# Patient Record
Sex: Female | Born: 1950 | Race: White | Hispanic: No | Marital: Married | Smoking: Never smoker
Health system: Southern US, Community
[De-identification: ages and names within clinical notes are randomized; demographics above are authoritative.]

## PROBLEM LIST (undated history)

## (undated) DIAGNOSIS — E782 Mixed hyperlipidemia: Secondary | ICD-10-CM

## (undated) DIAGNOSIS — K219 Gastro-esophageal reflux disease without esophagitis: Secondary | ICD-10-CM

## (undated) DIAGNOSIS — T7840XA Allergy, unspecified, initial encounter: Secondary | ICD-10-CM

## (undated) DIAGNOSIS — J302 Other seasonal allergic rhinitis: Secondary | ICD-10-CM

## (undated) DIAGNOSIS — E559 Vitamin D deficiency, unspecified: Secondary | ICD-10-CM

## (undated) HISTORY — DX: Allergy, unspecified, initial encounter: T78.40XA

## (undated) HISTORY — PX: BREAST BIOPSY: SHX20

## (undated) HISTORY — PX: TUBAL LIGATION: SHX77

## (undated) HISTORY — DX: Mixed hyperlipidemia: E78.2

## (undated) HISTORY — DX: Gastro-esophageal reflux disease without esophagitis: K21.9

## (undated) HISTORY — PX: LESION REMOVAL: SHX5196

## (undated) HISTORY — DX: Vitamin D deficiency, unspecified: E55.9

## (undated) HISTORY — DX: Other seasonal allergic rhinitis: J30.2

---

## 1994-02-12 HISTORY — PX: ABDOMINAL HYSTERECTOMY: SHX81

## 2017-04-19 ENCOUNTER — Encounter: Payer: Self-pay | Admitting: Family Medicine

## 2017-04-19 ENCOUNTER — Other Ambulatory Visit: Payer: Self-pay

## 2017-04-19 ENCOUNTER — Ambulatory Visit (INDEPENDENT_AMBULATORY_CARE_PROVIDER_SITE_OTHER): Payer: Medicare Other | Admitting: Family Medicine

## 2017-04-19 VITALS — BP 124/82 | HR 85 | Temp 98.0°F | Resp 16 | Ht 63.5 in | Wt 190.0 lb

## 2017-04-19 DIAGNOSIS — E2839 Other primary ovarian failure: Secondary | ICD-10-CM

## 2017-04-19 DIAGNOSIS — Z23 Encounter for immunization: Secondary | ICD-10-CM

## 2017-04-19 DIAGNOSIS — Z6833 Body mass index (BMI) 33.0-33.9, adult: Secondary | ICD-10-CM | POA: Diagnosis not present

## 2017-04-19 DIAGNOSIS — E6609 Other obesity due to excess calories: Secondary | ICD-10-CM

## 2017-04-19 DIAGNOSIS — Z1231 Encounter for screening mammogram for malignant neoplasm of breast: Secondary | ICD-10-CM | POA: Diagnosis not present

## 2017-04-19 DIAGNOSIS — H9311 Tinnitus, right ear: Secondary | ICD-10-CM

## 2017-04-19 DIAGNOSIS — Z1239 Encounter for other screening for malignant neoplasm of breast: Secondary | ICD-10-CM

## 2017-04-19 DIAGNOSIS — L989 Disorder of the skin and subcutaneous tissue, unspecified: Secondary | ICD-10-CM | POA: Diagnosis not present

## 2017-04-19 DIAGNOSIS — R232 Flushing: Secondary | ICD-10-CM | POA: Diagnosis not present
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
## 2017-04-19 NOTE — Patient Instructions (Addendum)
DASH Eating Plan DASH stands for "Dietary Approaches to Stop Hypertension." The DASH eating plan is a healthy eating plan that has been shown to reduce high blood pressure (hypertension). It may also reduce your risk for type 2 diabetes, heart disease, and stroke. The DASH eating plan may also help with weight loss. What are tips for following this plan? General guidelines  Avoid eating more than 2,300 mg (milligrams) of salt (sodium) a day. If you have hypertension, you may need to reduce your sodium intake to 1,500 mg a day.  Limit alcohol intake to no more than 1 drink a day for nonpregnant women and 2 drinks a day for men. One drink equals 12 oz of beer, 5 oz of wine, or 1 oz of hard liquor.  Work with your health care provider to maintain a healthy body weight or to lose weight. Ask what an ideal weight is for you.  Get at least 30 minutes of exercise that causes your heart to beat faster (aerobic exercise) most days of the week. Activities may include walking, swimming, or biking.  Work with your health care provider or diet and nutrition specialist (dietitian) to adjust your eating plan to your individual calorie needs. Reading food labels  Check food labels for the amount of sodium per serving. Choose foods with less than 5 percent of the Daily Value of sodium. Generally, foods with less than 300 mg of sodium per serving fit into this eating plan.  To find whole grains, look for the word "whole" as the first word in the ingredient list. Shopping  Buy products labeled as "low-sodium" or "no salt added."  Buy fresh foods. Avoid canned foods and premade or frozen meals. Cooking  Avoid adding salt when cooking. Use salt-free seasonings or herbs instead of table salt or sea salt. Check with your health care provider or pharmacist before using salt substitutes.  Do not fry foods. Cook foods using healthy methods such as baking, boiling, grilling, and broiling instead.  Cook with  heart-healthy oils, such as olive, canola, soybean, or sunflower oil. Meal planning   Eat a balanced diet that includes: ? 5 or more servings of fruits and vegetables each day. At each meal, try to fill half of your plate with fruits and vegetables. ? Up to 6-8 servings of whole grains each day. ? Less than 6 oz of lean meat, poultry, or fish each day. A 3-oz serving of meat is about the same size as a deck of cards. One egg equals 1 oz. ? 2 servings of low-fat dairy each day. ? A serving of nuts, seeds, or beans 5 times each week. ? Heart-healthy fats. Healthy fats called Omega-3 fatty acids are found in foods such as flaxseeds and coldwater fish, like sardines, salmon, and mackerel.  Limit how much you eat of the following: ? Canned or prepackaged foods. ? Food that is high in trans fat, such as fried foods. ? Food that is high in saturated fat, such as fatty meat. ? Sweets, desserts, sugary drinks, and other foods with added sugar. ? Full-fat dairy products.  Do not salt foods before eating.  Try to eat at least 2 vegetarian meals each week.  Eat more home-cooked food and less restaurant, buffet, and fast food.  When eating at a restaurant, ask that your food be prepared with less salt or no salt, if possible. What foods are recommended? The items listed may not be a complete list. Talk with your dietitian about what   dietary choices are best for you. Grains Whole-grain or whole-wheat bread. Whole-grain or whole-wheat pasta. Brown rice. Orpah Cobb. Bulgur. Whole-grain and low-sodium cereals. Pita bread. Low-fat, low-sodium crackers. Whole-wheat flour tortillas. Vegetables Fresh or frozen vegetables (raw, steamed, roasted, or grilled). Low-sodium or reduced-sodium tomato and vegetable juice. Low-sodium or reduced-sodium tomato sauce and tomato paste. Low-sodium or reduced-sodium canned vegetables. Fruits All fresh, dried, or frozen fruit. Canned fruit in natural juice (without  added sugar). Meat and other protein foods Skinless chicken or [place]. Ground chicken or [place]. Pork with fat trimmed off. Fish and seafood. Egg whites. Dried beans, peas, or lentils. Unsalted nuts, nut butters, and seeds. Unsalted canned beans. Lean cuts of beef with fat trimmed off. Low-sodium, lean deli meat. Dairy Low-fat (1%) or fat-free (skim) milk. Fat-free, low-fat, or reduced-fat cheeses. Nonfat, low-sodium ricotta or cottage cheese. Low-fat or nonfat yogurt. Low-fat, low-sodium cheese. Fats and oils Soft margarine without trans fats. Vegetable oil. Low-fat, reduced-fat, or light mayonnaise and salad dressings (reduced-sodium). Canola, safflower, olive, soybean, and sunflower oils. Avocado. Seasoning and other foods Herbs. Spices. Seasoning mixes without salt. Unsalted popcorn and pretzels. Fat-free sweets. What foods are not recommended? The items listed may not be a complete list. Talk with your dietitian about what dietary choices are best for you. Grains Baked goods made with fat, such as croissants, muffins, or some breads. Dry pasta or rice meal packs. Vegetables Creamed or fried vegetables. Vegetables in a cheese sauce. Regular canned vegetables (not low-sodium or reduced-sodium). Regular canned tomato sauce and paste (not low-sodium or reduced-sodium). Regular tomato and vegetable juice (not low-sodium or reduced-sodium). Rosita Fire. Olives. Fruits Canned fruit in a light or heavy syrup. Fried fruit. Fruit in cream or butter sauce. Meat and other protein foods Fatty cuts of meat. Ribs. Fried meat. Tomasa Blase. Sausage. Bologna and other processed lunch meats. Salami. Fatback. Hotdogs. Bratwurst. Salted nuts and seeds. Canned beans with added salt. Canned or smoked fish. Whole eggs or egg yolks. Chicken or [place] with skin. Dairy Whole or 2% milk, cream, and half-and-half. Whole or full-fat cream cheese. Whole-fat or sweetened yogurt. Full-fat cheese. Nondairy creamers. Whipped toppings.  Processed cheese and cheese spreads. Fats and oils Butter. Stick margarine. Lard. Shortening. Ghee. Bacon fat. Tropical oils, such as coconut, palm kernel, or palm oil. Seasoning and other foods Salted popcorn and pretzels. Onion salt, garlic salt, seasoned salt, table salt, and sea salt. Worcestershire sauce. Tartar sauce. Barbecue sauce. Teriyaki sauce. Soy sauce, including reduced-sodium. Steak sauce. Canned and packaged gravies. Fish sauce. Oyster sauce. Cocktail sauce. Horseradish that you find on the shelf. Ketchup. Mustard. Meat flavorings and tenderizers. Bouillon cubes. Hot sauce and Tabasco sauce. Premade or packaged marinades. Premade or packaged taco seasonings. Relishes. Regular salad dressings. Where to find more information:  National Heart, Lung, and Blood Institute: PopSteam.is  American Heart Association: www.heart.org Summary  The DASH eating plan is a healthy eating plan that has been shown to reduce high blood pressure (hypertension). It may also reduce your risk for type 2 diabetes, heart disease, and stroke.  With the DASH eating plan, you should limit salt (sodium) intake to 2,300 mg a day. If you have hypertension, you may need to reduce your sodium intake to 1,500 mg a day. When on the DASH eating plan, aim to eat more fresh fruits and vegetables, whole grains, lean proteins, low-fat dairy, and heart-healthy fats.Menopause and Herbal Products What is menopause? Menopause is the normal time of life when menstrual periods decrease in frequency and eventually stop  completely. This process can take several years for some women. Menopause is complete when you have had an absence of menstruation for a full year since your last menstrual period. It usually occurs between the ages of 25 and 33. It is not common for menopause to begin before the age of 85. During menopause, your body stops producing the female hormones estrogen and progesterone. Common symptoms associated  with this loss of hormones (vasomotor symptoms) are: Hot flashes. Hot flushes. Night sweats.  Other common symptoms and complications of menopause include: Decrease in sex drive. Vaginal dryness and thinning of the walls of the vagina. This can make sex painful. Dryness of the skin and development of wrinkles. Headaches. Tiredness. Irritability. Memory problems. Weight gain. Bladder infections. Hair growth on the face and chest. Inability to reproduce offspring (infertility). Loss of density in the bones (osteoporosis) increasing your risk for breaks (fractures). Depression. Hardening and narrowing of the arteries (atherosclerosis). This increases your risk of heart attack and stroke.  What treatment options are available? There are many treatment choices for menopause symptoms. The most common treatment is hormone replacement therapy. Many alternative therapies for menopause are emerging, including the use of herbal products. These supplements can be found in the form of herbs, teas, oils, tinctures, and pills. Common herbal supplements for menopause are made from plants that contain phytoestrogens. Phytoestrogens are compounds that occur naturally in plants and plant products. They act like estrogen in the body. Foods and herbs that contain phytoestrogens include: Soy. Flax seeds. Red clover. Ginseng.  What menopause symptoms may be helped if I use herbal products? Vasomotor symptoms. These may be helped by: Soy. Some studies show that soy may have a moderate benefit for hot flashes. Black cohosh. There is limited evidence indicating this may be beneficial for hot flashes. Symptoms that are related to heart and blood vessel disease. These may be helped by soy. Studies have shown that soy can help to lower cholesterol. Depression. This may be helped by: St. John's wort. There is limited evidence that shows this may help mild to moderate depression. Black cohosh. There is evidence  that this may help depression and mood swings. Osteoporosis. Soy may help to decrease bone loss that is associated with menopause and may prevent osteoporosis. Limited evidence indicates that red clover may offer some bone loss protection as well. Other herbal products that are commonly used during menopause lack enough evidence to support their use as a replacement for conventional menopause therapies. These products include evening primrose, ginseng, and red clover. What are the cases when herbal products should not be used during menopause? Do not use herbal products during menopause without your health care provider's approval if: You are taking medicine. You have a preexisting liver condition.  Are there any risks in my taking herbal products during menopause? If you choose to use herbal products to help with symptoms of menopause, keep in mind that: Different supplements have different and unmeasured amounts of herbal ingredients. Herbal products are not regulated the same way that medicines are. Concentrations of herbs may vary depending on the way they are prepared. For example, the concentration may be different in a pill, tea, oil, and tincture. Little is known about the risks of using herbal products, particularly the risks of long-term use. Some herbal supplements can be harmful when combined with certain medicines.  Most commonly reported side effects of herbal products are mild. However, if used improperly, many herbal supplements can cause serious problems. Talk to your  health care provider before starting any herbal product. If problems develop, stop taking the supplement and let your health care provider know. This information is not intended to replace advice given to you by your health care provider. Make sure you discuss any questions you have with your health care provider. Document Released: 07/18/2007 Document Revised: 12/27/2015 Document Reviewed: 07/14/2013 Elsevier  Interactive Patient Education  2017 [employer]. Venlafaxine extended-release capsules What is this medicine? VENLAFAXINE(VEN la fax een) is used to treat depression, anxiety and panic disorder. This medicine may be used for other purposes; ask your health care provider or pharmacist if you have questions. COMMON BRAND NAME(S): Effexor XR What should I tell my health care provider before I take this medicine? They need to know if you have any of these conditions: -bleeding disorders -glaucoma -heart disease -high blood pressure -high cholesterol -kidney disease -liver disease -low levels of sodium in the blood -mania or bipolar disorder -seizures -suicidal thoughts, plans, or attempt; a previous suicide attempt by you or a family -take medicines that treat or prevent blood clots -thyroid disease -an unusual or allergic reaction to venlafaxine, desvenlafaxine, other medicines, foods, dyes, or preservatives -pregnant or trying to get pregnant -breast-feeding How should I use this medicine? Take this medicine by mouth with a full glass of water. Follow the directions on the prescription label. Do not cut, crush, or chew this medicine. Take it with food. If needed, the capsule may be carefully opened and the entire contents sprinkled on a spoonful of cool applesauce. Swallow the applesauce/pellet mixture right away without chewing and follow with a glass of water to ensure complete swallowing of the pellets. Try to take your medicine at about the same time each day. Do not take your medicine more often than directed. Do not stop taking this medicine suddenly except upon the advice of your doctor. Stopping this medicine too quickly may cause serious side effects or your condition may worsen. A special MedGuide will be given to you by the pharmacist with each prescription and refill. Be sure to read this information carefully each time. Talk to your pediatrician regarding the use of this  medicine in children. Special care may be needed. Overdosage: If you think you have taken too much of this medicine contact a poison control center or emergency room at once. NOTE: This medicine is only for you. Do not share this medicine with others. What if I miss a dose? If you miss a dose, take it as soon as you can. If it is almost time for your next dose, take only that dose. Do not take double or extra doses. What may interact with this medicine? Do not take this medicine with any of the following medications: -certain medicines for fungal infections like fluconazole, itraconazole, ketoconazole, posaconazole, voriconazole -cisapride -desvenlafaxine -dofetilide -dronedarone -duloxetine -levomilnacipran -linezolid -MAOIs like Carbex, Eldepryl, Marplan, Nardil, and Parnate -methylene blue (injected into a vein) -milnacipran -pimozide -thioridazine -ziprasidone This medicine may also interact with the following medications: -amphetamines -aspirin and aspirin-like medicines -certain medicines for depression, anxiety, or psychotic disturbances -certain medicines for migraine headaches like almotriptan, eletriptan, frovatriptan, naratriptan, rizatriptan, sumatriptan, zolmitriptan -certain medicines for sleep -certain medicines that treat or prevent blood clots like dalteparin, enoxaparin, warfarin -cimetidine -clozapine -diuretics -fentanyl -furazolidone -indinavir -isoniazid -lithium -metoprolol -NSAIDS, medicines for pain and inflammation, like ibuprofen or naproxen -other medicines that prolong the QT interval (cause an abnormal heart rhythm) -procarbazine -rasagiline -supplements like St. John's wort, kava kava, valerian -tramadol -tryptophan This  list may not describe all possible interactions. Give your health care provider a list of all the medicines, herbs, non-prescription drugs, or dietary supplements you use. Also tell them if you smoke, drink alcohol, or use  illegal drugs. Some items may interact with your medicine. What should I watch for while using this medicine? Tell your doctor if your symptoms do not get better or if they get worse. Visit your doctor or health care professional for regular checks on your progress. Because it may take several weeks to see the full effects of this medicine, it is important to continue your treatment as prescribed by your doctor. Patients and their families should watch out for new or worsening thoughts of suicide or depression. Also watch out for sudden changes in feelings such as feeling anxious, agitated, panicky, irritable, hostile, aggressive, impulsive, severely restless, overly excited and hyperactive, or not being able to sleep. If this happens, especially at the beginning of treatment or after a change in dose, call your health care professional. This medicine can cause an increase in blood pressure. Check with your doctor for instructions on monitoring your blood pressure while taking this medicine. You may get drowsy or dizzy. Do not drive, use machinery, or do anything that needs mental alertness until you know how this medicine affects you. Do not stand or sit up quickly, especially if you are an older patient. This reduces the risk of dizzy or fainting spells. Alcohol may interfere with the effect of this medicine. Avoid alcoholic drinks. Your mouth may get dry. Chewing sugarless gum, sucking hard candy and drinking plenty of water will help. Contact your doctor if the problem does not go away or is severe. What side effects may I notice from receiving this medicine? Side effects that you should report to your doctor or health care professional as soon as possible: -allergic reactions like skin rash, itching or hives, swelling of the face, lips, or tongue -anxious -breathing problems -confusion -changes in vision -chest pain -confusion -elevated mood, decreased need for sleep, racing thoughts, impulsive  behavior -eye pain -fast, irregular heartbeat -feeling faint or lightheaded, falls -feeling agitated, angry, or irritable -hallucination, loss of contact with reality -high blood pressure -loss of balance or coordination -palpitations -redness, blistering, peeling or loosening of the skin, including inside the mouth -restlessness, pacing, inability to keep still -seizures -stiff muscles -suicidal thoughts or other mood changes -trouble passing urine or change in the amount of urine -trouble sleeping -unusual bleeding or bruising -unusually weak or tired -vomiting Side effects that usually do not require medical attention (report to your doctor or health care professional if they continue or are bothersome): -change in sex drive or performance -change in appetite or weight -constipation -dizziness -dry mouth -headache -increased sweating -nausea -tired This list may not describe all possible side effects. Call your doctor for medical advice about side effects. You may report side effects to FDA at 1-800-FDA-1088. Where should I keep my medicine? Keep out of the reach of children. Store at a controlled temperature between 20 and 25 degrees C (68 degrees and 77 degrees F), in a dry place. Throw away any unused medicine after the expiration date. NOTE: This sheet is a summary. It may not cover all possible information. If you have questions about this medicine, talk to your doctor, pharmacist, or health care provider.  2018 Elsevier/Gold Standard (2015-06-30 18:38:02)    Work with your health care provider or diet and nutrition specialist (dietitian) to adjust your eating plan  to your individual calorie needs. This information is not intended to replace advice given to you by your health care provider. Make sure you discuss any questions you have with your health care provider. Document Released: 01/18/2011 Document Revised: 01/23/2016 Document Reviewed: 01/23/2016 Elsevier  Interactive Patient Education  [employer]2018 Elsevier Inc.

## 2017-05-02 ENCOUNTER — Telehealth: Payer: Self-pay | Admitting: Family Medicine

## 2017-05-02 NOTE — Telephone Encounter (Signed)
No ENT referral in patients workquque

## 2017-05-02 NOTE — Telephone Encounter (Signed)
Patient left message to check on status of ENT referral

## 2017-05-02 NOTE — Telephone Encounter (Signed)
Referral is in computer but not how Dusty can see. She is fixing.

## 2017-05-24 ENCOUNTER — Other Ambulatory Visit: Payer: Self-pay

## 2017-05-24 ENCOUNTER — Encounter: Payer: Self-pay | Admitting: Family Medicine

## 2017-05-24 ENCOUNTER — Other Ambulatory Visit (HOSPITAL_COMMUNITY)
Admission: RE | Admit: 2017-05-24 | Discharge: 2017-05-24 | Disposition: A | Discharge status: Home / Self Care | Payer: Medicare Other | Source: Ambulatory Visit | Attending: Family Medicine | Admitting: Family Medicine

## 2017-05-24 ENCOUNTER — Ambulatory Visit (INDEPENDENT_AMBULATORY_CARE_PROVIDER_SITE_OTHER): Payer: Medicare Other | Admitting: Family Medicine

## 2017-05-24 VITALS — BP 130/86 | HR 94 | Temp 97.9°F | Resp 16 | Ht 64.0 in | Wt 190.0 lb

## 2017-05-24 DIAGNOSIS — Z0000 Encounter for general adult medical examination without abnormal findings: Secondary | ICD-10-CM

## 2017-05-24 DIAGNOSIS — N3001 Acute cystitis with hematuria: Secondary | ICD-10-CM | POA: Diagnosis not present

## 2017-05-24 DIAGNOSIS — Z780 Asymptomatic menopausal state: Secondary | ICD-10-CM | POA: Diagnosis not present

## 2017-05-24 DIAGNOSIS — Z23 Encounter for immunization: Secondary | ICD-10-CM | POA: Diagnosis not present

## 2017-05-24 DIAGNOSIS — R300 Dysuria: Secondary | ICD-10-CM

## 2017-05-24 DIAGNOSIS — L989 Disorder of the skin and subcutaneous tissue, unspecified: Secondary | ICD-10-CM | POA: Diagnosis not present

## 2017-05-24 LAB — CBC WITH DIFFERENTIAL/PLATELET
BASOS PCT: 0.4 %
Basophils Absolute: 21 cells/uL (ref 0–200)
Eosinophils Absolute: 177 cells/uL (ref 15–500)
Eosinophils Relative: 3.4 %
HEMATOCRIT: 40.5 % (ref 35.0–45.0)
HEMOGLOBIN: 13.8 g/dL (ref 11.7–15.5)
LYMPHS ABS: 1258 {cells}/uL (ref 850–3900)
MCH: 28.6 pg (ref 27.0–33.0)
MCHC: 34.1 g/dL (ref 32.0–36.0)
MCV: 84 fL (ref 80.0–100.0)
MONOS PCT: 7.1 %
MPV: 10.7 fL (ref 7.5–12.5)
NEUTROS ABS: 3375 {cells}/uL (ref 1500–7800)
Neutrophils Relative %: 64.9 %
Platelets: 191 10*3/uL (ref 140–400)
RBC: 4.82 10*6/uL (ref 3.80–5.10)
RDW: 12.6 % (ref 11.0–15.0)
Total Lymphocyte: 24.2 %
WBC: 5.2 10*3/uL (ref 3.8–10.8)
WBCMIX: 369 {cells}/uL (ref 200–950)

## 2017-05-24 LAB — LIPID PANEL
CHOL/HDL RATIO: 4.6 (calc) (ref <5.0)
CHOLESTEROL: 251 mg/dL — AB (ref <200)
HDL: 55 mg/dL (ref >50)
LDL CHOLESTEROL (CALC): 170 mg/dL — AB
NON-HDL CHOLESTEROL (CALC): 196 mg/dL — AB (ref <130)
Triglycerides: 126 mg/dL (ref <150)

## 2017-05-24 LAB — COMPLETE METABOLIC PANEL WITH GFR
AG Ratio: 2 (calc) (ref 1.0–2.5)
ALT: 16 U/L (ref 6–29)
AST: 16 U/L (ref 10–35)
Albumin: 4.1 g/dL (ref 3.6–5.1)
Alkaline phosphatase (APISO): 106 U/L (ref 33–130)
BUN: 18 mg/dL (ref 7–25)
CALCIUM: 8.8 mg/dL (ref 8.6–10.4)
CO2: 29 mmol/L (ref 20–32)
CREATININE: 0.7 mg/dL (ref 0.50–0.99)
Chloride: 107 mmol/L (ref 98–110)
GFR, EST AFRICAN AMERICAN: 105 mL/min/{1.73_m2} (ref >=60)
GFR, Est Non African American: 91 mL/min/{1.73_m2} (ref >=60)
GLUCOSE: 98 mg/dL (ref 65–99)
Globulin: 2.1 g/dL (calc) (ref 1.9–3.7)
POTASSIUM: 4 mmol/L (ref 3.5–5.3)
Sodium: 141 mmol/L (ref 135–146)
TOTAL PROTEIN: 6.2 g/dL (ref 6.1–8.1)
Total Bilirubin: 0.4 mg/dL (ref 0.2–1.2)

## 2017-05-24 LAB — POCT URINALYSIS DIPSTICK
Bilirubin, UA: NEGATIVE
Glucose, UA: NEGATIVE
KETONES UA: NEGATIVE
Nitrite, UA: POSITIVE
PH UA: 5.5 (ref 5.0–8.0)
Spec Grav, UA: >=1.03 — AB (ref 1.010–1.025)
Urobilinogen, UA: 0.2 E.U./dL

## 2017-05-24 MED ORDER — CIPROFLOXACIN HCL 250 MG PO TABS: 250.0000 mg | ORAL_TABLET | Freq: Two times a day (BID) | ORAL | 0 refills | Status: AC

## 2017-05-24 NOTE — Patient Instructions (Signed)
Please schedule mammogram and DEXA scan at check-out

## 2017-05-24 NOTE — Progress Notes (Signed)
Subjective:    Laura Sellers is a 66 y.o. female who presents for a Welcome to Medicare exam.   Review of Systems Review of Systems  Constitutional: Negative for activity change, appetite change and fever.  HENT: Negative for ear discharge, nosebleeds, postnasal drip, sinus pressure, sneezing, sore throat, tinnitus, trouble swallowing and voice change.   Eyes: Negative for visual disturbance.  Respiratory: Negative for cough, chest tightness, shortness of breath, wheezing and stridor.   Cardiovascular: Negative for chest pain, palpitations and leg swelling.  Gastrointestinal: Negative for abdominal pain, nausea, rectal pain and vomiting.  Endocrine: Negative for polyphagia and polyuria.  Genitourinary: Positive for dysuria. Negative for difficulty urinating, dyspareunia, flank pain, frequency, genital sores, hematuria, pelvic pain, urgency, vaginal bleeding, vaginal discharge and vaginal pain.  Skin: Negative for color change, rash and wound.  Neurological: Negative for dizziness, tremors, syncope, facial asymmetry, speech difficulty, weakness, light-headedness and numbness.  Hematological: Negative for adenopathy.  Has had some moles that have increased in size on body in diffent places.  Has upcoming appointment with ENT for clicking sound in ear. Has had some burning with urination over the past several days.       Objective:    Today's Vitals   05/24/17 0807  BP: 130/86  Pulse: 94  Resp: 16  Temp: 97.9 F (36.6 C)  TempSrc: Temporal  SpO2: 97%  Weight: 190 lb (86.2 kg)  Height: 5\' 4"  (1.626 m)  Body mass index is 32.61 kg/m.  Medications Outpatient Encounter Medications as of 05/24/2017  Medication Sig  . Calcium Carb-Cholecalciferol (CALCIUM 600+D) 600-800 MG-UNIT TABS Take 1 tablet by mouth 2 (two) times daily.  . Coenzyme Q10 (COQ-10) 50 MG CAPS Take 3 capsules by mouth daily.  . Multiple Vitamin (MULTIVITAMIN) capsule Take 1 capsule by mouth daily.  .  [DISCONTINUED] calcium acetate (PHOSLO) 667 MG capsule Take by mouth 3 (three) times daily with meals.  . ciprofloxacin (CIPRO) 250 MG tablet Take 1 tablet (250 mg total) by mouth 2 (two) times daily for 5 days.   No facility-administered encounter medications on file as of 05/24/2017.      History: Past Medical History:  Diagnosis Date  . Allergy    Past Surgical History:  Procedure Laterality Date  . ABDOMINAL HYSTERECTOMY  1996   complete; done for heavy frequent menses.   . LESION REMOVAL Left    Breast; benign  . TUBAL LIGATION      Family History  Problem Relation Age of Onset  . Heart disease Mother   . Obesity Father   . Heart disease Father   . Breast cancer Sister    Social History   Occupational History  . Not on file  Tobacco Use  . Smoking status: Never Smoker  . Smokeless tobacco: Never Used  Substance and Sexual Activity  . Alcohol use: No    Frequency: Never  . Drug use: No  . Sexual activity: Not Currently    Partners: Male    Birth control/protection: Surgical    Tobacco Counseling Counseling given: Yes Does not smoke and does not plan to smoke.   Immunizations and Health Maintenance Immunization History  Administered Date(s) Administered  . Td 05/24/2017   Health Maintenance Due  Topic Date Due      . PAP SMEAR  Done today  . MAMMOGRAM  ordered  . DEXA SCAN  ordered    Activities of Daily Living In your present state of health, do you have any difficulty  performing the following activities: 05/24/2017  Hearing? N  Vision? N  Difficulty concentrating or making decisions? N  Walking or climbing stairs? N  Dressing or bathing? N  Doing errands, shopping? N    Physical Exam  Vital signs reviewed.  Patient alert and oriented no acute distress.  Pleasant white female.  Normocephalic atraumatic.  Pupils equally round reactive to light.  Corrective lenses worn.  Extraocular muscles intact.  No lesions in oral cavity.  Dentition in good  repair.  Uvula midline.  No edema in oral cavity.  External ear canals patent.  Nasal turbinates clear bilaterally.  No edema.  Moist mucous membranes.  Neck supple with full range of motion.  Thyroid not palpable.  No carotid bruits auscultated bilaterally.  Skin with multiple pigmented irregular shaped lesions throughout.  Evidence of cherry angiomas.  Senile lentigines present.  Scattered seborrheic keratoses present.  Heart regular rate and rhythm.  Lungs clear to auscultation bilaterally.  Abdomen is soft, nondistended nontender.  Positive bowel sounds.  No hepatosplenomegaly.  Cranial nerves II through XII grossly intact.  Strength 5 out of 5 in upper and lower extremities.  2+ dorsalis pedis pulses.  No CVA tenderness to palpation.  Breasts with normal shape and symmetry bilaterally.  No palpable masses in breast bilaterally.  No nipple discharge present.  No skin lesions of breasts bilaterally.  Normal female external genitalia.  Labia normal.  No skin lesions in vaginal area.  No cervix present on examination.  No adnexal palpable.  Mood euthymic.  Affect congruent with mood. Advanced Directives: deferred.       Assessment:    This is a routine wellness examination for this patient .   Vision/Hearing screen  Visual Acuity Screening   Right eye Left eye Both eyes  Without correction:     With correction: 20/20 20/20 20/20     Dietary issues and exercise activities discussed:    Lifestyle modifications discussed with patient including a diet emphasizing vegetables, fruits, and whole grains. Limiting intake of sodium to less than 2,400 mg per day.  Recommendations discussed include consuming low-fat dairy products, poultry, fish, legumes, non-tropical vegetable oils, and nuts; and limiting intake of sweets, sugar-sweetened beverages, and red meat. Discussed following a plan such as the Dietary Approaches to Stop Hypertension (DASH) diet. Patient to read up on this diet.  The patient is asked  to make an attempt to improve diet and exercise patterns to aid in medical management of this problem.   Goals    Weight loss Exercise routinely      Depression Screen PHQ 2/9 Scores 05/24/2017 04/19/2017  PHQ - 2 Score 0 0     Fall Risk Fall Risk  05/24/2017  Falls in the past year? No    Cognitive Function: normal.       Activities of Daily Living In your present state of health, do you have any difficulty performing the following activities: 05/24/2017  Hearing? N  Vision? N  Difficulty concentrating or making decisions? N  Walking or climbing stairs? N  Dressing or bathing? N  Doing errands, shopping? N     Immunizations and Health Maintenance Immunization History  Administered Date(s) Administered  . Td 05/24/2017   Health Maintenance Due  Topic Date Due  . TETANUS/TDAP  02/12/1971  . PAP SMEAR  02/11/1973  . MAMMOGRAM  02/11/2002  . DEXA SCAN  02/11/2017   Screening Tests Health Maintenance  Topic Date Due  . TETANUS/TDAP  02/12/1971  . PAP  SMEAR  02/11/1973  . MAMMOGRAM  02/11/2002  . DEXA SCAN  02/11/2017  . INFLUENZA VACCINE  12/20/2017 (Originally 09/12/2017)  . Hepatitis C Screening  04/20/2018 (Originally May 22, 1950)  . HIV Screening  04/20/2018 (Originally 02/12/1967)  . PNA vac Low Risk Adult (1 of 2 - PCV13) 04/20/2018 (Originally 02/11/2017)  . COLONOSCOPY  02/13/2020    Qualifies for Shingles Vaccine? Patient defers vaccine.   Cancer Screenings: Lung: Low Dose CT Chest recommended if Age 49-80 years, 30 pack-year currently smoking OR have quit w/in 15years. Patient does not qualify. Breast: Up to date on Mammogram? ordered   Up to date of Bone Density/Dexa? ordered Colorectal: UTD  Additional Screenings:  Hepatitis C Screening: ordered.      Plan:    I have personally reviewed and noted the following in the patient's chart:   . Medical and social history . Use of alcohol, tobacco or illicit drugs  . Current medications and  supplements . Functional ability and status . Nutritional status . Physical activity Personally reviewed all the history. EKG done.  Age appropriate anticipatory guidance given.  Labs ordered.  UTI: POCT positive. Culture ordered. Treat with cipro as ordered, cipro 250 mg po bid for 7 days.  Counseled Regarding worrisome signs of UTI emergency department or return to clinic. Skin lesion: refer to derm for full skin survey.   Janine Limbo. Tracie Harrier, MD

## 2017-05-24 NOTE — Progress Notes (Signed)
Patient ID: Laura Sellers, female    DOB: 1950/06/05, 67 y.o.   MRN: [id number]  Chief Complaint  Patient presents with  . Annual Exam  . Medicare Wellness    Welcome to Medicare    Allergies Patient has no known allergies.  Subjective:   Laura Sellers is a 67 y.o. female who presents to [facility] today.  HPI HPI  Past Medical History:  Diagnosis Date  . Allergy     Past Surgical History:  Procedure Laterality Date  . ABDOMINAL HYSTERECTOMY  1996   complete; done for heavy frequent menses.   . LESION REMOVAL Left    Breast; benign  . TUBAL LIGATION      Family History  Problem Relation Age of Onset  . Heart disease Mother   . Obesity Father   . Heart disease Father      Social History   Socioeconomic History  . Marital status: Married    Spouse name: Not on file  . Number of children: Not on file  . Years of education: Not on file  . Highest education level: Not on file  Occupational History  . Not on file  Social Needs  . Financial resource strain: Not on file  . Food insecurity:    Worry: Not on file    Inability: Not on file  . Transportation needs:    Medical: Not on file    Non-medical: Not on file  Tobacco Use  . Smoking status: Never Smoker  . Smokeless tobacco: Never Used  Substance and Sexual Activity  . Alcohol use: No    Frequency: Never  . Drug use: No  . Sexual activity: Never    Birth control/protection: Surgical  Lifestyle  . Physical activity:    Days per week: Not on file    Minutes per session: Not on file  . Stress: Not on file  Relationships  . Social connections:    Talks on phone: Not on file    Gets together: Not on file    Attends religious service: Not on file    Active member of club or organization: Not on file    Attends meetings of clubs or organizations: Not on file    Relationship status: Not on file  Other Topics Concern  . Not on file  Social History Narrative   Moved to this  area several years ago for a job. Moved from near [place], [place]. Quit the job after 6 weeks b/c did not like the job. Thought would find another job. Is not working at this time. Mother still lives in [place] and she helps take care of her. Originally born in [place], [place].   Married for over 47 years.    Three children, all in their 26s.   Youngest son has Type 1 diabetes. Has two grandchildren.   Enjoys time with friends, walk, read.   Attends church at [employer] in [place], [place].   Got Medicare for insurance.    Eats all food groups.   Wears seatbelt. Drives.    Smoke detectors in home.     Review of Systems   Objective:   BP 130/86 (BP Location: Left Arm, Patient Position: Sitting, Cuff Size: Normal)   Pulse 94   Temp 97.9 F (36.6 C) (Temporal)   Resp 16   Ht 5\' 4"  (1.626 m)   Wt 190 lb (86.2 kg)   SpO2 97%   BMI 32.61 kg/m  Physical Exam   Assessment and Plan   There are no diagnoses linked to this encounter.   No follow-ups on file. Mack HookBreanna  Carah Barrientes, LPN 1/61/09604/01/2018

## 2017-05-27 LAB — URINE CULTURE

## 2017-05-28 ENCOUNTER — Encounter: Payer: Self-pay | Admitting: Family Medicine

## 2017-05-28 ENCOUNTER — Telehealth: Payer: Self-pay | Admitting: Family Medicine

## 2017-05-28 DIAGNOSIS — R8290 Unspecified abnormal findings in urine: Secondary | ICD-10-CM

## 2017-05-28 LAB — CYTOLOGY - PAP: DIAGNOSIS: NEGATIVE

## 2017-05-28 NOTE — Telephone Encounter (Signed)
Left generic message requesting call back.  

## 2017-05-28 NOTE — Telephone Encounter (Signed)
Please call and advise that her urine did grow out greater than 100, 000 colonies of e. Coli bacteria. She does have a urinary tract infection and she needs to complete all the medication that I gave her at her visit.  In addition, she should come back to the lab for a repeat urine test in one month or let us know sooner if her symptoms are not better or they return.   Please advise her that her cholesterol is very high. She needs to follow up to discuss initiating medication to decrease her cholesterol and lower her cardiovascular risk.   Advise that urine specimen can be left at the lab beside our office.

## 2017-05-29 NOTE — Telephone Encounter (Signed)
Patient informed of message below, verbalized understanding.  

## 2017-06-03 ENCOUNTER — Ambulatory Visit (HOSPITAL_COMMUNITY)
Admission: RE | Admit: 2017-06-03 | Discharge: 2017-06-03 | Disposition: A | Discharge status: Home / Self Care | Payer: Medicare Other | Source: Ambulatory Visit | Attending: Family Medicine | Admitting: Family Medicine

## 2017-06-03 ENCOUNTER — Encounter (HOSPITAL_COMMUNITY): Payer: Self-pay

## 2017-06-03 ENCOUNTER — Other Ambulatory Visit (HOSPITAL_COMMUNITY): Payer: Medicare Other

## 2017-06-03 DIAGNOSIS — Z13820 Encounter for screening for osteoporosis: Secondary | ICD-10-CM | POA: Diagnosis present

## 2017-06-03 DIAGNOSIS — M8588 Other specified disorders of bone density and structure, other site: Secondary | ICD-10-CM | POA: Diagnosis not present

## 2017-06-03 DIAGNOSIS — E2839 Other primary ovarian failure: Secondary | ICD-10-CM | POA: Diagnosis present

## 2017-06-03 DIAGNOSIS — Z1231 Encounter for screening mammogram for malignant neoplasm of breast: Secondary | ICD-10-CM | POA: Insufficient documentation

## 2017-06-03 DIAGNOSIS — Z1239 Encounter for other screening for malignant neoplasm of breast: Secondary | ICD-10-CM

## 2017-06-10 ENCOUNTER — Ambulatory Visit (INDEPENDENT_AMBULATORY_CARE_PROVIDER_SITE_OTHER): Payer: Medicare Other | Admitting: Otolaryngology

## 2017-06-10 ENCOUNTER — Encounter: Payer: Self-pay | Admitting: Family Medicine

## 2017-06-10 DIAGNOSIS — H9313 Tinnitus, bilateral: Secondary | ICD-10-CM | POA: Diagnosis not present

## 2017-06-10 DIAGNOSIS — H903 Sensorineural hearing loss, bilateral: Secondary | ICD-10-CM

## 2017-06-25 ENCOUNTER — Ambulatory Visit (INDEPENDENT_AMBULATORY_CARE_PROVIDER_SITE_OTHER): Payer: Medicare Other | Admitting: Family Medicine

## 2017-06-25 ENCOUNTER — Encounter: Payer: Self-pay | Admitting: Family Medicine

## 2017-06-25 ENCOUNTER — Other Ambulatory Visit: Payer: Self-pay

## 2017-06-25 VITALS — BP 116/70 | HR 82 | Temp 98.4°F | Resp 12 | Ht 63.5 in | Wt 190.1 lb

## 2017-06-25 DIAGNOSIS — E785 Hyperlipidemia, unspecified: Secondary | ICD-10-CM | POA: Diagnosis not present

## 2017-06-25 DIAGNOSIS — R8290 Unspecified abnormal findings in urine: Secondary | ICD-10-CM | POA: Diagnosis not present

## 2017-06-25 NOTE — Patient Instructions (Signed)
American Heart Association Family doctor. Org Increase fiber Mediterranean Diet A Mediterranean diet refers to food and lifestyle choices that are based on the traditions of countries located on the [employer] [place]. This way of eating has been shown to help prevent certain conditions and improve outcomes for people who have chronic diseases, like kidney disease and heart disease. What are tips for following this plan? Lifestyle Cook and eat meals together with your family, when possible. Drink enough fluid to keep your urine clear or pale yellow. Be physically active every day. This includes: Aerobic exercise like running or swimming. Leisure activities like gardening, walking, or housework. Get 7-8 hours of sleep each night. If recommended by your health care provider, drink red wine in moderation. This means 1 glass a day for nonpregnant women and 2 glasses a day for men. A glass of wine equals 5 oz (150 mL). Reading food labels Check the serving size of packaged foods. For foods such as rice and pasta, the serving size refers to the amount of cooked product, not dry. Check the total fat in packaged foods. Avoid foods that have saturated fat or trans fats. Check the ingredients list for added sugars, such as corn syrup. Shopping At the grocery store, buy most of your food from the areas near the walls of the store. This includes: Fresh fruits and vegetables (produce). Grains, beans, nuts, and seeds. Some of these may be available in unpackaged forms or large amounts (in bulk). Fresh seafood. Poultry and eggs. Low-fat dairy products. Buy whole ingredients instead of prepackaged foods. Buy fresh fruits and vegetables in-season from local farmers markets. Buy frozen fruits and vegetables in resealable bags. If you do not have access to quality fresh seafood, buy precooked frozen shrimp or canned fish, such as tuna, salmon, or sardines. Buy small amounts of raw or cooked vegetables,  salads, or olives from the deli or salad bar at your store. Stock your pantry so you always have certain foods on hand, such as olive oil, canned tuna, canned tomatoes, rice, pasta, and beans. Cooking Cook foods with extra-virgin olive oil instead of using butter or other vegetable oils. Have meat as a side dish, and have vegetables or grains as your main dish. This means having meat in small portions or adding small amounts of meat to foods like pasta or stew. Use beans or vegetables instead of meat in common dishes like chili or lasagna. Experiment with different cooking methods. Try roasting or broiling vegetables instead of steaming or sauteing them. Add frozen vegetables to soups, stews, pasta, or rice. Add nuts or seeds for added healthy fat at each meal. You can add these to yogurt, salads, or vegetable dishes. Marinate fish or vegetables using olive oil, lemon juice, garlic, and fresh herbs. Meal planning Plan to eat 1 vegetarian meal one day each week. Try to work up to 2 vegetarian meals, if possible. Eat seafood 2 or more times a week. Have healthy snacks readily available, such as: Vegetable sticks with hummus. Greek yogurt. Fruit and nut trail mix. Eat balanced meals throughout the week. This includes: Fruit: 2-3 servings a day Vegetables: 4-5 servings a day Low-fat dairy: 2 servings a day Fish, poultry, or lean meat: 1 serving a day Beans and legumes: 2 or more servings a week Nuts and seeds: 1-2 servings a day Whole grains: 6-8 servings a day Extra-virgin olive oil: 3-4 servings a day Limit red meat and sweets to only a few servings a month What are  my food choices? Mediterranean diet Recommended Grains: Whole-grain pasta. Brown rice. Bulgar wheat. Polenta. Couscous. Whole-wheat bread. Orpah Cobb. Vegetables: Artichokes. Beets. Broccoli. Cabbage. Carrots. Eggplant. Green beans. Chard. Kale. Spinach. Onions. Leeks. Peas. Squash. Tomatoes. Peppers. Radishes. Fruits:  Apples. Apricots. Avocado. Berries. Bananas. Cherries. Dates. Figs. Grapes. Lemons. Melon. Oranges. Peaches. Plums. Pomegranate. Meats and other protein foods: Beans. Almonds. Sunflower seeds. Pine nuts. Peanuts. Cod. Salmon. Scallops. Shrimp. Tuna. Tilapia. Clams. Oysters. Eggs. Dairy: Low-fat milk. Cheese. Greek yogurt. Beverages: Water. Red wine. Herbal tea. Fats and oils: Extra virgin olive oil. Avocado oil. Grape seed oil. Sweets and desserts: [place] yogurt with honey. Baked apples. Poached pears. Trail mix. Seasoning and other foods: Basil. Cilantro. Coriander. Cumin. Mint. Parsley. Sage. Rosemary. Tarragon. Garlic. Oregano. Thyme. Pepper. Balsalmic vinegar. Tahini. Hummus. Tomato sauce. Olives. Mushrooms. Limit these Grains: Prepackaged pasta or rice dishes. Prepackaged cereal with added sugar. Vegetables: Deep fried potatoes (french fries). Fruits: Fruit canned in syrup. Meats and other protein foods: Beef. Pork. Lamb. Poultry with skin. Hot dogs. Tomasa Blase. Dairy: Ice cream. Sour cream. Whole milk. Beverages: Juice. Sugar-sweetened soft drinks. Beer. Liquor and spirits. Fats and oils: Butter. Canola oil. Vegetable oil. Beef fat (tallow). Lard. Sweets and desserts: Cookies. Cakes. Pies. Candy. Seasoning and other foods: Mayonnaise. Premade sauces and marinades. The items listed may not be a complete list. Talk with your dietitian about what dietary choices are right for you. Summary The Mediterranean diet includes both food and lifestyle choices. Eat a variety of fresh fruits and vegetables, beans, nuts, seeds, and whole grains. Limit the amount of red meat and sweets that you eat. Talk with your health care provider about whether it is safe for you to drink red wine in moderation. This means 1 glass a day for nonpregnant women and 2 glasses a day for men. A glass of wine equals 5 oz (150 mL). This information is not intended to replace advice given to you by your health care provider. Make  sure you discuss any questions you have with your health care provider. Document Released: 09/22/2015 Document Revised: 10/25/2015 Document Reviewed: 09/22/2015 Elsevier Interactive Patient Education  2018 [employer]. Cholesterol Cholesterol is a fat. Your body needs a small amount of cholesterol. Cholesterol (plaque) may build up in your blood vessels (arteries). That makes you more likely to have a heart attack or stroke. You cannot feel your cholesterol level. Having a blood test is the only way to find out if your level is high. Keep your test results. Work with your doctor to keep your cholesterol at a good level. What do the results mean?  Total cholesterol is how much cholesterol is in your blood.  LDL is bad cholesterol. This is the type that can build up. Try to have low LDL.  HDL is good cholesterol. It cleans your blood vessels and carries LDL away. Try to have high HDL.  Triglycerides are fat that the body can store or burn for energy. What are good levels of cholesterol?  Total cholesterol below 200.  LDL below 100 is good for people who have health risks. LDL below 70 is good for people who have very high risks.  HDL above 40 is good. It is best to have HDL of 60 or higher.  Triglycerides below 150. How can I lower my cholesterol? Diet Follow your diet program as told by your doctor.  Choose fish, white meat chicken, or [place] that is roasted or baked. Try not to eat red meat, fried foods, sausage, or  lunch meats.  Eat lots of fresh fruits and vegetables.  Choose whole grains, beans, pasta, potatoes, and cereals.  Choose olive oil, corn oil, or canola oil. Only use small amounts.  Try not to eat butter, mayonnaise, shortening, or palm kernel oils.  Try not to eat foods with trans fats.  Choose low-fat or nonfat dairy foods. ? Drink skim or nonfat milk. ? Eat low-fat or nonfat yogurt and cheeses. ? Try not to drink whole milk or cream. ? Try not to eat ice  cream, egg yolks, or full-fat cheeses.  Healthy desserts include angel food cake, ginger snaps, animal crackers, hard candy, popsicles, and low-fat or nonfat frozen yogurt. Try not to eat pastries, cakes, pies, and cookies.  Exercise Follow your exercise program as told by your doctor.  Be more active. Try gardening, walking, and taking the stairs.  Ask your doctor about ways that you can be more active.  Medicine  Take over-the-counter and prescription medicines only as told by your doctor. This information is not intended to replace advice given to you by your health care provider. Make sure you discuss any questions you have with your health care provider. Document Released: 04/27/2008 Document Revised: 08/31/2015 Document Reviewed: 08/11/2015 Elsevier Interactive Patient Education  [employer].

## 2017-06-25 NOTE — Progress Notes (Signed)
Patient ID: Laura Sellers, female    DOB: [birth year], 67 y.o.   MRN: [id number]  Chief Complaint  Patient presents with  . Results    follow up    Allergies Patient has no known allergies.  Subjective:   Laura Sellers is a 67 y.o. female who presents to [facility] today.  HPI Here for follow up to discuss labs. Has a Family history of hyperlipidemia.  She reports that her mother had bypass surgery in her 50s.  Patient reports she is here to discuss her cholesterol.  Is interested in dietary measures to lower her cholesterol.  She has no history of high blood pressure.  She does not smoke.  She does occasionally exercise.  She has no history of a GI bleed.  Is not on chronic NSAIDs or steroids.  Has no history of dyspepsia or current dyspepsia. Patient reports that she is not having any burning with urination at this time.  At her physical examination she had had some urinary frequency and her urine looked a little bit cloudy.  She is also noticed some issues related with her low back pain.  She felt that her back pain had slightly worsened.  Her urine culture did grow out E. coli.  Her urinalysis at that time did also show some red blood cells, nitrites, and leukocytes.  She reports that she completed all the medication and her symptoms resolved.  She reports that is not having any burning with urination. Urinate normal.  Denies any vaginal dryness.  No history of chronic urinary tract infections.  Denies any fever, nausea, vomiting or lower abdominal pain or pressure.     Past Medical History:  Diagnosis Date  . Allergy     Past Surgical History:  Procedure Laterality Date  . ABDOMINAL HYSTERECTOMY  1996   complete; done for heavy frequent menses.   Marland Kitchen BREAST BIOPSY Right   . LESION REMOVAL Left    Breast; benign  . TUBAL LIGATION      Family History  Problem Relation Age of Onset  . Heart disease Mother   . Obesity Father   . Heart disease Father     . Breast cancer Sister      Social History   Socioeconomic History  . Marital status: Married    Spouse name: Not on file  . Number of children: Not on file  . Years of education: Not on file  . Highest education level: Not on file  Occupational History  . Not on file  Social Needs  . Financial resource strain: Not on file  . Food insecurity:    Worry: Not on file    Inability: Not on file  . Transportation needs:    Medical: Not on file    Non-medical: Not on file  Tobacco Use  . Smoking status: Never Smoker  . Smokeless tobacco: Never Used  Substance and Sexual Activity  . Alcohol use: No    Frequency: Never  . Drug use: No  . Sexual activity: Not Currently    Partners: Male    Birth control/protection: Surgical  Lifestyle  . Physical activity:    Days per week: 2 days    Minutes per session: 30 min  . Stress: Not at all  Relationships  . Social connections:    Talks on phone: Not on file    Gets together: Not on file    Attends religious service: Not on file  Active member of club or organization: Not on file    Attends meetings of clubs or organizations: Not on file    Relationship status: Not on file  Other Topics Concern  . Not on file  Social History Narrative   Moved to this area several years ago for a job. Moved from near [place], [place]. Quit the job after 6 weeks b/c did not like the job. Thought would find another job. Is not working at this time. Mother still lives in [place] and she helps take care of her. Originally born in [place], [place].   Married for over 47 years.    Three children, all in their 70s.   Youngest son has Type 1 diabetes. Has two grandchildren.   Enjoys time with friends, walk, read.   Attends church at [employer] in [place], [place].   Got Medicare for insurance.    Eats all food groups.   Wears seatbelt. Drives.    Smoke detectors in home.    Current Outpatient Medications on File Prior to Visit   Medication Sig Dispense Refill  . Calcium Carb-Cholecalciferol (CALCIUM 600+D) 600-800 MG-UNIT TABS Take 1 tablet by mouth 2 (two) times daily.    . Multiple Vitamin (MULTIVITAMIN) capsule Take 1 capsule by mouth daily.     No current facility-administered medications on file prior to visit.     Review of Systems  Constitutional: Negative for activity change, appetite change and fever.  Eyes: Negative for visual disturbance.  Respiratory: Negative for cough, chest tightness and shortness of breath.   Cardiovascular: Negative for chest pain, palpitations and leg swelling.  Gastrointestinal: Negative for abdominal pain, nausea and vomiting.  Endocrine: Negative for polyphagia and polyuria.  Genitourinary: Negative for decreased urine volume, difficulty urinating, dysuria, flank pain, frequency, urgency, vaginal bleeding, vaginal discharge and vaginal pain.  Neurological: Negative for dizziness, syncope and light-headedness.  Hematological: Negative for adenopathy.     Objective:   BP 116/70 (BP Location: Left Arm, Patient Position: Sitting, Cuff Size: Large)   Pulse 82   Temp 98.4 F (36.9 C) (Oral)   Resp 12   Ht 5' 3.5" (1.613 m)   Wt 190 lb 1.3 oz (86.2 kg)   SpO2 94%   BMI 33.14 kg/m   Physical Exam  Constitutional: She appears well-developed and well-nourished.  Cardiovascular: Normal rate, regular rhythm and normal heart sounds.  Pulmonary/Chest: Effort normal and breath sounds normal.  Skin: Skin is warm and dry.  Psychiatric: She has a normal mood and affect. Her behavior is normal. Judgment and thought content normal.  Vitals reviewed.    Assessment and Plan   1. Abnormal urine, status post recent UTI. Patient will leave urine specimen to ensure clearance. She was counseled regarding ways to decrease UTIs including do not hold urine, urinate before and after sexual intercourse, always wipe front to back, and make sure she maintains a good hydration status.  She  was also counseled regarding worrisome signs and symptoms of urinary tract infection and if those occur to please call or return to clinic.  She voiced understanding.  We will contact her with results. - Urinalysis, Routine w reflex microscopic - Urine Culture  2. Hyperlipidemia, unspecified hyperlipidemia type Lifestyle modifications discussed with patient including a diet emphasizing vegetables, fruits, and whole grains. Limiting intake of sodium to less than 2,400 mg per day.  Recommendations discussed include consuming low-fat dairy products, poultry, fish, legumes, non-tropical vegetable oils, and nuts; and limiting intake of sweets, sugar-sweetened beverages,  and red meat. Discussed following a plan such as the training diet. Patient to read up on this diet.  Patient was given handouts.  In addition, she was asked to read at the American Heart Association website and family [employer].  We discussed lifestyle and dietary modifications.  She was asked to increase her exercise.  We discussed rechecking her cholesterol in 3 months.  At that time if her cholesterol is not improved we will consider statins.  Her ASCVD 10-year cardiovascular risk is approximately 5.5%. - Lipid panel Office visit was greater than 25 minutes.  Greater than 50% of office visit spent counseling and coordinating care.  Counseled on the above aforementioned information. Return in about 3 months (around 09/25/2017). Aliene Beams, MD 06/25/2017

## 2017-07-04 ENCOUNTER — Encounter: Payer: Self-pay | Admitting: Family Medicine

## 2017-07-04 LAB — URINALYSIS, ROUTINE W REFLEX MICROSCOPIC
Bilirubin Urine: NEGATIVE
Glucose, UA: NEGATIVE
HGB URINE DIPSTICK: NEGATIVE
Ketones, ur: NEGATIVE
Leukocytes, UA: NEGATIVE
NITRITE: NEGATIVE
Protein, ur: NEGATIVE
Specific Gravity, Urine: 1.013 (ref 1.001–1.03)

## 2017-07-04 LAB — URINE CULTURE
MICRO NUMBER:: 90618722
SPECIMEN QUALITY:: ADEQUATE

## 2017-07-18 ENCOUNTER — Encounter: Payer: Self-pay | Admitting: Family Medicine

## 2017-07-19 ENCOUNTER — Encounter: Payer: Self-pay | Admitting: Family Medicine

## 2017-09-30 ENCOUNTER — Telehealth: Payer: Self-pay | Admitting: Family Medicine

## 2017-09-30 NOTE — Telephone Encounter (Signed)
Patient scheduled followup for this Thursday afternoon. She is requesting her lab order be done before she comes in to check her cholesterol.

## 2017-10-01 ENCOUNTER — Telehealth: Payer: Self-pay

## 2017-10-01 DIAGNOSIS — E785 Hyperlipidemia, unspecified: Secondary | ICD-10-CM

## 2017-10-01 NOTE — Telephone Encounter (Signed)
Please order lipid panel. Thanks. Janine Limboachel H. Tracie HarrierHagler, MD'

## 2017-10-01 NOTE — Telephone Encounter (Signed)
Lipid panel ordered per Dr.Hagler

## 2017-10-01 NOTE — Telephone Encounter (Signed)
Lipid panel ordered per Dr.Hagler 

## 2017-10-02 NOTE — Telephone Encounter (Signed)
New Message  Pt verbalized she is wanting to speak to the nurse.  Please f/u

## 2017-10-02 NOTE — Telephone Encounter (Signed)
Left message requesting call back to discuss message

## 2017-10-03 ENCOUNTER — Ambulatory Visit: Payer: Medicare Other | Admitting: Family Medicine

## 2017-10-03 NOTE — Telephone Encounter (Signed)
Spoke with patient and it has been taken care of.

## 2017-10-08 LAB — LIPID PANEL
Cholesterol: 253 mg/dL — ABNORMAL HIGH (ref <200)
HDL: 46 mg/dL — ABNORMAL LOW (ref >50)
LDL CHOLESTEROL (CALC): 173 mg/dL — AB
NON-HDL CHOLESTEROL (CALC): 207 mg/dL — AB (ref <130)
TRIGLYCERIDES: 183 mg/dL — AB (ref <150)
Total CHOL/HDL Ratio: 5.5 (calc) — ABNORMAL HIGH (ref <5.0)

## 2017-10-09 ENCOUNTER — Encounter: Payer: Self-pay | Admitting: Family Medicine

## 2017-10-10 ENCOUNTER — Other Ambulatory Visit: Payer: Self-pay

## 2017-10-10 ENCOUNTER — Encounter: Payer: Self-pay | Admitting: Family Medicine

## 2017-10-10 ENCOUNTER — Ambulatory Visit: Payer: Medicare Other | Admitting: Family Medicine

## 2017-10-10 VITALS — BP 114/66 | HR 83 | Temp 98.3°F | Resp 12 | Ht 63.5 in | Wt 191.0 lb

## 2017-10-10 DIAGNOSIS — M85852 Other specified disorders of bone density and structure, left thigh: Secondary | ICD-10-CM | POA: Diagnosis not present

## 2017-10-10 DIAGNOSIS — R232 Flushing: Secondary | ICD-10-CM

## 2017-10-10 DIAGNOSIS — E785 Hyperlipidemia, unspecified: Secondary | ICD-10-CM | POA: Diagnosis not present

## 2017-10-10 LAB — TSH: TSH: 3.25 m[IU]/L (ref 0.40–4.50)

## 2017-10-10 NOTE — Progress Notes (Signed)
Patient ID: Laura Sellers, female    DOB: Apr 14, 1950, 67 y.o.   MRN: [id number]  Chief Complaint  Patient presents with  . Hyperlipidemia    follow up    Allergies Patient has no known allergies.  Subjective:   Laura Sellers is a 67 y.o. female who presents to [facility] today.  HPI Laura Sellers presents today for follow-up to discuss her cholesterol.  She reports that she has been eating a healthier diet and trying to exercise.  She is here to discuss her cholesterol values.  Compared to her values 3 months ago her LDL is unchanged, her HDL has decreased, her triglycerides have increased and her total cholesterol is approximately the same.  She is not interested in starting any medication to lower her cholesterol.  She is not interested in taking statins.  She does not smoke.  She does not have a history of high blood pressure.  She does not like to take medications.  She would like to get her thyroid function checked.  She reports that she occasionally has some hot flashes when in church and has had difficulty losing weight.  She has a strong family history of hypothyroidism.  Her mother and daughter both have this condition.  She has never taken any medication for thyroid disorder.  Bowel movements are normal.  Energy level is good.  Skin is not dry.  She does feel like her hair is thinner than it used to be.  She has no patchy hair loss.  She sleeps well at night.  She does not have daily hot flashes but reports that she can get hot from time to time.  She used hormone replacement therapy in the past after hysterectomy but has been off of all hormone replacement therapy for 3 to 4 years.  Denies any vaginal bleeding.  Would also like to review her bone density test which was performed in April 2019.  She does take daily vitamin D and calcium supplementation.  She also exercises by walking multiple days a week.   Past Medical History:  Diagnosis Date  . Allergy      Past Surgical History:  Procedure Laterality Date  . ABDOMINAL HYSTERECTOMY  1996   complete; done for heavy frequent menses.   Marland Kitchen BREAST BIOPSY Right   . LESION REMOVAL Left    Breast; benign  . TUBAL LIGATION      Family History  Problem Relation Age of Onset  . Heart disease Mother   . Hypothyroidism Mother   . Obesity Father   . Heart disease Father   . Breast cancer Sister   . Hypothyroidism Daughter      Social History   Socioeconomic History  . Marital status: Married    Spouse name: Not on file  . Number of children: Not on file  . Years of education: Not on file  . Highest education level: Not on file  Occupational History  . Not on file  Social Needs  . Financial resource strain: Not on file  . Food insecurity:    Worry: Not on file    Inability: Not on file  . Transportation needs:    Medical: Not on file    Non-medical: Not on file  Tobacco Use  . Smoking status: Never Smoker  . Smokeless tobacco: Never Used  Substance and Sexual Activity  . Alcohol use: No    Frequency: Never  . Drug use: No  . Sexual activity:  Not Currently    Partners: Male    Birth control/protection: Surgical  Lifestyle  . Physical activity:    Days per week: 2 days    Minutes per session: 30 min  . Stress: Not at all  Relationships  . Social connections:    Talks on phone: Not on file    Gets together: Not on file    Attends religious service: Not on file    Active member of club or organization: Not on file    Attends meetings of clubs or organizations: Not on file    Relationship status: Not on file  Other Topics Concern  . Not on file  Social History Narrative   Moved to this area several years ago for a job. Moved from near [place], [place]. Quit the job after 6 weeks b/c did not like the job. Thought would find another job. Is not working at this time. Mother still lives in [place] and she helps take care of her. Originally born [place], [place].   Married for  over 47 years.    Three children, all in their 8940s.   Youngest son has Type 1 diabetes. Has two grandchildren.   Enjoys time with friends, walk, read.   Attends church at [employer] in [place], [place].   Got Medicare for insurance.    Eats all food groups.   Wears seatbelt. Drives.    Smoke detectors in home.     Review of Systems  Constitutional: Negative for activity change, appetite change, chills, diaphoresis, fever and unexpected weight change.  Eyes: Negative for visual disturbance.  Respiratory: Negative for cough, chest tightness and shortness of breath.   Cardiovascular: Negative for chest pain, palpitations and leg swelling.  Gastrointestinal: Negative for abdominal pain, nausea and vomiting.  Endocrine:       At times does feel she gets overheated easily.  Genitourinary: Negative for dysuria, frequency and urgency.  Neurological: Negative for dizziness, syncope and light-headedness.  Hematological: Negative for adenopathy.  Psychiatric/Behavioral: Negative for dysphoric mood and sleep disturbance.     Objective:   BP 114/66 (BP Location: Right Arm, Patient Position: Sitting, Cuff Size: Large)   Pulse 83   Temp 98.3 F (36.8 C) (Temporal)   Resp 12   Ht 5' 3.5" (1.613 m)   Wt 191 lb 0.6 oz (86.7 kg)   SpO2 96% Comment: room air  BMI 33.31 kg/m   Physical Exam  Constitutional: She is oriented to person, place, and time. She appears well-developed and well-nourished.  HENT:  Head: Normocephalic and atraumatic.  Mouth/Throat: Oropharynx is clear and moist.  Eyes: Pupils are equal, round, and reactive to light. Conjunctivae are normal.  Neck: Normal range of motion. Neck supple. No JVD present. No tracheal deviation present. No thyromegaly present.  Cardiovascular: Normal rate, regular rhythm and normal heart sounds.  Pulmonary/Chest: Effort normal and breath sounds normal.  Lymphadenopathy:    She has no cervical adenopathy.  Neurological: She  is alert and oriented to person, place, and time.  Skin: Skin is warm and dry. Capillary refill takes less than 2 seconds.  Psychiatric: She has a normal mood and affect. Her behavior is normal. Judgment and thought content normal.  Vitals reviewed.    Assessment and Plan   1. Hyperlipidemia, unspecified hyperlipidemia type Patient defers medication.  We did discuss continue lifestyle modifications that can possibly lower her cholesterol.  Diet, exercise, and healthy lifestyle modifications discussed.  She defers statin medication. Hyperlipidemia and the associated  risk of ASCVD were discussed today. Primary vs. Secondary prevention of ASCVD were discussed and how it relates to patient morbidity, mortality, and quality of life. Shared decision making with patient including the risks of statins vs.benefits of ASCVD risk reduction discussed.  2. Hot flashes We will check thyroid function today due to her risk factors and family history.  She does report she sporadically gets hot flashes.  This is not bothersome to her or she does not seek further evaluation. - TSH Due to her family history of thyroid abnormalities and the fact that she does not want to have to come back again to discuss her thyroid function test.  We did discuss thyroid hormone supplementation if indeed her laboratory testing indicated.  We discussed risk versus benefits of this medication.  We discussed root and administration of medication.  We also discussed that thyroid hormone supplementation need to be taken on an empty stomach, meaning 30 minutes prior to eating or 2 hours after eating.  We also discussed that if she was to start medication that her lab values would need to be checked in 2 to 3 months to monitor her medication.  She was also encouraged to go ahead and schedule a visit with her new PCP within the next 2 to 3 months. 3.  Osteopenia Patient did have some osteopenia at her hip.  Otherwise her bone density of her  spine was within normal limits.  She will continue lifestyle modifications and weightbearing exercise.  She will continue daily calcium and vitamin D Supplementation.  This visit was greater than 25 minutes.  Greater than 50% of office visit was spent counseling and coordinating care.  We did review her bone density testing.  A copy was printed for her today.  Diet, exercise, and healthy lifestyle modifications discussed.  She was encouraged to keep her regular scheduled routine health maintenance.  She does defer influenza and Pneumovax vaccinations today. No follow-ups on file. Aliene Beams, MD 10/10/2017

## 2017-10-12 ENCOUNTER — Encounter: Payer: Self-pay | Admitting: Family Medicine

## 2017-10-15 ENCOUNTER — Encounter: Payer: Self-pay | Admitting: Family Medicine

## 2019-11-27 ENCOUNTER — Other Ambulatory Visit (HOSPITAL_COMMUNITY): Payer: Self-pay | Admitting: Family Medicine

## 2019-11-27 DIAGNOSIS — Z1231 Encounter for screening mammogram for malignant neoplasm of breast: Secondary | ICD-10-CM

## 2020-04-02 ENCOUNTER — Ambulatory Visit
Admission: RE | Admit: 2020-04-02 | Discharge: 2020-04-02 | Disposition: A | Discharge status: Home / Self Care | Payer: Medicare Other | Source: Ambulatory Visit | Attending: Family Medicine | Admitting: Family Medicine

## 2020-04-02 ENCOUNTER — Other Ambulatory Visit: Payer: Self-pay

## 2020-04-02 VITALS — BP 123/80 | HR 71 | Temp 98.6°F | Resp 16

## 2020-04-02 DIAGNOSIS — R002 Palpitations: Secondary | ICD-10-CM

## 2020-04-02 DIAGNOSIS — Z711 Person with feared health complaint in whom no diagnosis is made: Secondary | ICD-10-CM

## 2020-04-02 MED ORDER — BLOOD PRESSURE MONITOR/L CUFF MISC: 1.0000 | Freq: Two times a day (BID) | 0 refills | Status: AC

## 2020-04-02 NOTE — Discharge Instructions (Signed)
Please begin checking your blood pressure and keeping a log  Discussed importance of low salt diet and exercise  Please follow up with PCP for further evaluation and management of blood pressure  Return or go to the ER if you experience any new or worsening symptoms such as: headache, vision changes, dizziness, lightheadedness, chest pain, shortness of breath, numbness or tingling in extremities, abdominal pain, changes in bowel or bladder habits

## 2020-04-02 NOTE — ED Provider Notes (Signed)
[facility]    CSN: [id number] Arrival date & time: 04/02/20  1053      History   Chief Complaint No chief complaint on file.   HPI Laura Sellers is a 70 y.o. female.   Reports that over the last week she has noticed that her blood pressures have been fluctuating up and down. Reports that she has also been feeling palpitations and that she has never experienced this before. Reports that she can feel "her pulse in her head." Does not have personal cardiac history, but does have cardiac family history. Reports feeling fatigued and then anxious when these episodes occur. Had tele visit scheduled with PCP yesterday but was unable to get her phone to work. Is concerned about her heart today. Denies chest pain, weakness, diaphoresis, back pain, neck pain, jaw pain, arm pain.  ROS per HPI  The history is provided by the patient.    Past Medical History:  Diagnosis Date  . Allergy     Patient Active Problem List   Diagnosis Date Noted  . Hyperlipidemia 10/10/2017    Past Surgical History:  Procedure Laterality Date  . ABDOMINAL HYSTERECTOMY  1996   complete; done for heavy frequent menses.   Marland Kitchen BREAST BIOPSY Right   . LESION REMOVAL Left    Breast; benign  . TUBAL LIGATION      OB History    Gravida  3   Para  3   Term  3   Preterm      AB      Living  3     SAB      IAB      Ectopic      Multiple      Live Births           Obstetric Comments  No abnormal pap smears. No sexually transmitted diseases.          Home Medications    Prior to Admission medications   Medication Sig Start Date End Date Taking? Authorizing Provider  Blood Pressure Monitoring (BLOOD PRESSURE MONITOR/L CUFF) MISC 1 each by Does not apply route in the morning and at bedtime. 04/02/20  Yes Moshe Cipro, NP  aspirin (BAYER ASPIRIN EC LOW DOSE) 81 MG EC tablet Take 81 mg by mouth daily. Swallow whole.    [provider]  Calcium  Carb-Cholecalciferol (CALCIUM 600+D) 600-800 MG-UNIT TABS Take 1 tablet by mouth 2 (two) times daily.    [provider]  Coenzyme Q10 (CO Q 10) 100 MG CAPS Take 100 mg by mouth daily.    [provider]  Glucosamine HCl (GLUCOSAMINE PO) Take 1 capsule by mouth 2 (two) times daily.    [provider]  Multiple Vitamin (MULTIVITAMIN) capsule Take 1 capsule by mouth daily.    [provider]    Family History Family History  Problem Relation Age of Onset  . Heart disease Mother   . Hypothyroidism Mother   . Obesity Father   . Heart disease Father   . Breast cancer Sister   . Hypothyroidism Daughter     Social History Social History   Tobacco Use  . Smoking status: Never Smoker  . Smokeless tobacco: Never Used  Vaping Use  . Vaping Use: Never used  Substance Use Topics  . Alcohol use: No  . Drug use: No     Allergies   Patient has no known allergies.   Review of Systems Review of Systems  Physical Exam Triage Vital Signs ED Triage Vitals  Enc Vitals Group     BP 04/02/20 1122 123/80     Pulse Rate 04/02/20 1122 71     Resp 04/02/20 1122 16     Temp 04/02/20 1122 98.6 F (37 C)     Temp Source 04/02/20 1122 Oral     SpO2 04/02/20 1122 93 %     Weight --      Height --      Head Circumference --      Peak Flow --      Pain Score 04/02/20 1134 4     Pain Loc --      Pain Edu? --      Excl. in GC? --    No data found.  Updated Vital Signs BP 123/80 (BP Location: Right Arm)   Pulse 71   Temp 98.6 F (37 C) (Oral)   Resp 16   SpO2 93%   Visual Acuity Right Eye Distance:   Left Eye Distance:   Bilateral Distance:    Right Eye Near:   Left Eye Near:    Bilateral Near:     Physical Exam Vitals and nursing note reviewed.  Constitutional:      General: She is not in acute distress.    Appearance: Normal appearance. She is well-developed and well-nourished.  HENT:     Head: Normocephalic and atraumatic.  Eyes:      Conjunctiva/sclera: Conjunctivae normal.  Cardiovascular:     Rate and Rhythm: Normal rate. Rhythm irregular.  No extrasystoles are present.    Chest Wall: PMI is not displaced. No thrill.     Pulses: No decreased pulses.     Heart sounds: Heart sounds not distant. No murmur heard.   Pulmonary:     Effort: Pulmonary effort is normal. No respiratory distress.     Breath sounds: Normal breath sounds. No stridor. No wheezing, rhonchi or rales.  Chest:     Chest wall: No tenderness.  Abdominal:     Palpations: Abdomen is soft.     Tenderness: There is no abdominal tenderness.  Musculoskeletal:        General: No edema. Normal range of motion.     Cervical back: Normal range of motion and neck supple.     Right lower leg: No edema.     Left lower leg: No edema.  Skin:    General: Skin is warm and dry.     Capillary Refill: Capillary refill takes less than 2 seconds.  Neurological:     General: No focal deficit present.     Mental Status: She is alert and oriented to person, place, and time.  Psychiatric:        Mood and Affect: Mood and affect and mood normal.        Behavior: Behavior normal.        Thought Content: Thought content normal.        UC Treatments / Results  Labs (all labs ordered are listed, but only abnormal results are displayed) Labs Reviewed - No data to display  EKG   Radiology No results found.  Procedures Procedures (including critical care time)  Medications Ordered in UC Medications - No data to display  Initial Impression / Assessment and Plan / UC Course  I have reviewed the triage vital signs and the nursing notes.  Pertinent labs & imaging results that were available during my care of the patient were reviewed by me  and considered in my medical decision making (see chart for details).    Palpitations  EKG shows few PVCs No STEMI Discussed that if symptoms worsen, she needs to follow up with the ER Otherwise follow up with  PCP, they may want to put her on a Holter monitor or refer to cardiology Paper rx for new BP cuff as her cuff does not match with office BP readings Then log BP with new cuff to be sure that the pressures are accurate Follow up if they are still up and down or if they continue to be elevated  Final Clinical Impressions(s) / UC Diagnoses   Final diagnoses:  Palpitations     Discharge Instructions     Please begin checking your blood pressure and keeping a log  Discussed importance of low salt diet and exercise  Please follow up with PCP for further evaluation and management of blood pressure  Return or go to the ER if you experience any new or worsening symptoms such as: headache, vision changes, dizziness, lightheadedness, chest pain, shortness of breath, numbness or tingling in extremities, abdominal pain, changes in bowel or bladder habits     ED Prescriptions    Medication Sig Dispense Auth. Provider   Blood Pressure Monitoring (BLOOD PRESSURE MONITOR/L CUFF) MISC 1 each by Does not apply route in the morning and at bedtime. 1 each Moshe Cipro, NP     PDMP not reviewed this encounter.   Moshe Cipro, NP 04/02/20 [phone]

## 2020-04-02 NOTE — ED Triage Notes (Signed)
Head pain on last Friday and she checked her BP and it was high.  States her BP and pulse have been fluctuating alot

## 2020-05-18 ENCOUNTER — Other Ambulatory Visit (HOSPITAL_COMMUNITY): Payer: Self-pay | Admitting: Family Medicine

## 2020-05-18 DIAGNOSIS — Z1231 Encounter for screening mammogram for malignant neoplasm of breast: Secondary | ICD-10-CM

## 2020-05-18 DIAGNOSIS — E7800 Pure hypercholesterolemia, unspecified: Secondary | ICD-10-CM | POA: Diagnosis not present

## 2020-05-18 DIAGNOSIS — R002 Palpitations: Secondary | ICD-10-CM | POA: Diagnosis not present

## 2020-05-18 DIAGNOSIS — E559 Vitamin D deficiency, unspecified: Secondary | ICD-10-CM | POA: Diagnosis not present

## 2020-05-18 DIAGNOSIS — M85859 Other specified disorders of bone density and structure, unspecified thigh: Secondary | ICD-10-CM | POA: Diagnosis not present

## 2020-06-01 ENCOUNTER — Other Ambulatory Visit: Payer: Self-pay

## 2020-06-01 ENCOUNTER — Ambulatory Visit (HOSPITAL_COMMUNITY)
Admission: RE | Admit: 2020-06-01 | Discharge: 2020-06-01 | Disposition: A | Discharge status: Home / Self Care | Payer: Medicare Other | Source: Ambulatory Visit | Attending: Family Medicine | Admitting: Family Medicine

## 2020-06-01 DIAGNOSIS — Z13820 Encounter for screening for osteoporosis: Secondary | ICD-10-CM | POA: Diagnosis not present

## 2020-06-01 DIAGNOSIS — Z780 Asymptomatic menopausal state: Secondary | ICD-10-CM | POA: Diagnosis not present

## 2020-06-01 DIAGNOSIS — M85851 Other specified disorders of bone density and structure, right thigh: Secondary | ICD-10-CM | POA: Diagnosis not present

## 2020-06-01 DIAGNOSIS — Z1231 Encounter for screening mammogram for malignant neoplasm of breast: Secondary | ICD-10-CM

## 2020-06-06 ENCOUNTER — Other Ambulatory Visit (HOSPITAL_COMMUNITY): Payer: Self-pay | Admitting: Family Medicine

## 2020-06-06 DIAGNOSIS — R928 Other abnormal and inconclusive findings on diagnostic imaging of breast: Secondary | ICD-10-CM

## 2020-06-07 ENCOUNTER — Other Ambulatory Visit: Payer: Self-pay

## 2020-06-07 ENCOUNTER — Ambulatory Visit (HOSPITAL_COMMUNITY)
Admission: RE | Admit: 2020-06-07 | Discharge: 2020-06-07 | Disposition: A | Discharge status: Home / Self Care | Payer: Medicare Other | Source: Ambulatory Visit | Attending: Family Medicine | Admitting: Family Medicine

## 2020-06-07 DIAGNOSIS — R928 Other abnormal and inconclusive findings on diagnostic imaging of breast: Secondary | ICD-10-CM

## 2020-06-07 DIAGNOSIS — R922 Inconclusive mammogram: Secondary | ICD-10-CM | POA: Diagnosis not present

## 2020-06-08 ENCOUNTER — Ambulatory Visit (HOSPITAL_COMMUNITY): Payer: Medicare Other

## 2020-06-08 ENCOUNTER — Other Ambulatory Visit (HOSPITAL_COMMUNITY): Payer: Medicare Other

## 2020-07-13 DIAGNOSIS — K219 Gastro-esophageal reflux disease without esophagitis: Secondary | ICD-10-CM | POA: Diagnosis not present

## 2020-07-14 ENCOUNTER — Encounter: Payer: Self-pay | Admitting: Cardiology

## 2020-07-14 NOTE — Progress Notes (Signed)
Cardiology Office Note  Date: 07/15/2020   ID: Laura Sellers, DOB [birth year], MRN [id number]  PCP:  Aliene Beams, MD  Cardiologist:  Nona Dell, MD Electrophysiologist:  None   Chief Complaint  Patient presents with  . Palpitations    History of Present Illness: Laura Sellers is a 70 y.o. female referred for cardiology consultation by Dr. Tracie Harrier for the evaluation of palpitations.  She reports a history of intermittent and unpredictable episodes of near syncope.  These tend to last only for a few seconds, no obvious precipitant, she has never had frank syncope.  She sometimes has a sense of palpitations but not exclusively.  Approximately 2 months ago she did have an episode of rapid palpitations that occurred after being emotionally upset, also with elevated blood pressure.  She was seen at an urgent care with no arrhythmia documented.  Since that time she has had no similar events.  I reviewed her medications which are noted below.  She does not have any history of cardiac arrhythmia.  I reviewed her lab work from April as noted below.   Past Medical History:  Diagnosis Date  . Acid reflux   . Mixed hyperlipidemia   . Seasonal allergies   . Vitamin D deficiency     Past Surgical History:  Procedure Laterality Date  . ABDOMINAL HYSTERECTOMY  1996  . BREAST BIOPSY Right   . LESION REMOVAL Left    Breast; benign  . TUBAL LIGATION      Current Outpatient Medications  Medication Sig Dispense Refill  . aspirin 81 MG EC tablet Take 81 mg by mouth daily. Swallow whole.    . Blood Pressure Monitoring (BLOOD PRESSURE MONITOR/L CUFF) MISC 1 each by Does not apply route in the morning and at bedtime. 1 each 0  . Calcium Carb-Cholecalciferol 600-800 MG-UNIT TABS Take 1 tablet by mouth 2 (two) times daily.    . Glucosamine HCl (GLUCOSAMINE PO) Take 1 capsule by mouth 2 (two) times daily.    . Multiple Vitamin (MULTIVITAMIN) capsule Take 1 capsule by mouth daily.     . rosuvastatin (CRESTOR) 10 MG tablet Take 10 mg by mouth daily.    . Coenzyme Q10 (CO Q 10) 100 MG CAPS Take 100 mg by mouth daily. (Patient not taking: Reported on 07/15/2020)     No current facility-administered medications for this visit.   Allergies:  Patient has no known allergies.   Social History: The patient  reports that she has never smoked. She has never used smokeless tobacco. She reports that she does not drink alcohol and does not use drugs.   Family History: The patient's family history includes Breast cancer in her sister; Heart disease in her father and mother; Hypothyroidism in her daughter and mother; Obesity in her father.   ROS: No orthopnea or PND.  Physical Exam: VS:  BP 136/76   Pulse 77   Ht 5' 3.5" (1.613 m)   Wt 193 lb (87.5 kg)   SpO2 96%   BMI 33.65 kg/m , BMI Body mass index is 33.65 kg/m.  Wt Readings from Last 3 Encounters:  07/15/20 193 lb (87.5 kg)  10/10/17 191 lb 0.6 oz (86.7 kg)  06/25/17 190 lb 1.3 oz (86.2 kg)    General: Patient appears comfortable at rest. HEENT: Conjunctiva and lids normal, wearing a mask. Neck: Supple, no elevated JVP or carotid bruits, no thyromegaly. Lungs: Clear to auscultation, nonlabored breathing at rest. Cardiac: Regular rate and rhythm, no  S3 or significant systolic murmur, no pericardial rub. Abdomen: Soft, nontender, bowel sounds present. Extremities: No pitting edema, distal pulses 2+. Skin: Warm and dry. Musculoskeletal: No kyphosis. Neuropsychiatric: Alert and oriented x3, affect grossly appropriate.  ECG:  An ECG dated 04/04/2020 was personally reviewed today and demonstrated:  Sinus rhythm with PVC.  Recent Labwork:    Component Value Date/Time   CHOL 253 (H) 10/08/2017 0752   TRIG 183 (H) 10/08/2017 0752   HDL 46 (L) 10/08/2017 0752   CHOLHDL 5.5 (H) 10/08/2017 0752   LDLCALC 173 (H) 10/08/2017 0752  October 2021: Cholesterol 270, triglycerides 198, HDL 50, LDL 182 April 2022: TSH 2.24,  hemoglobin 14.1, platelets 211, BUN 17, creatinine 0.78, potassium 5.0, AST 27, ALT 21  Other Studies Reviewed Today:  No prior cardiac testing for review.  Assessment and Plan:  Intermittent episodes of presyncope and also palpitations as discussed above.  No frank syncope, no reproducible exertional chest discomfort or limiting shortness of breath.  I reviewed her ECG from February, also interval lab work.  We discussed obtaining a 14-day Zio patch for further investigation.  If this is unrevealing, may just recommend observation and clinical follow-up over time unless symptoms escalate.  Medication Adjustments/Labs and Tests Ordered: Current medicines are reviewed at length with the patient today.  Concerns regarding medicines are outlined above.   Tests Ordered: No orders of the defined types were placed in this encounter.   Medication Changes: No orders of the defined types were placed in this encounter.   Disposition:  Follow up test results.  Signed, Jonelle Sidle, MD, [facility] 07/15/2020 11:15 AM    [facility] [address], [place], [place] [address] Phone: [phone]; Fax: [phone]

## 2020-07-15 ENCOUNTER — Other Ambulatory Visit: Payer: Self-pay | Admitting: Cardiology

## 2020-07-15 ENCOUNTER — Encounter: Payer: Self-pay | Admitting: Cardiology

## 2020-07-15 ENCOUNTER — Other Ambulatory Visit: Payer: Self-pay

## 2020-07-15 ENCOUNTER — Ambulatory Visit: Payer: Medicare Other | Admitting: Cardiology

## 2020-07-15 ENCOUNTER — Ambulatory Visit (INDEPENDENT_AMBULATORY_CARE_PROVIDER_SITE_OTHER): Payer: Medicare Other

## 2020-07-15 VITALS — BP 136/76 | HR 77 | Ht 63.5 in | Wt 193.0 lb

## 2020-07-15 DIAGNOSIS — R55 Syncope and collapse: Secondary | ICD-10-CM

## 2020-07-15 DIAGNOSIS — R42 Dizziness and giddiness: Secondary | ICD-10-CM

## 2020-07-15 DIAGNOSIS — R002 Palpitations: Secondary | ICD-10-CM

## 2020-07-15 NOTE — Patient Instructions (Signed)
Medication Instructions:  Your physician recommends that you continue on your current medications as directed. Please refer to the Current Medication list given to you today.  *If you need a refill on your cardiac medications before your next appointment, please call your pharmacy*   Lab Work: None today  If you have labs (blood work) drawn today and your tests are completely normal, you will receive your results only by: Marland Kitchen MyChart Message (if you have MyChart) OR . A paper copy in the mail If you have any lab test that is abnormal or we need to change your treatment, we will call you to review the results.   Testing/Procedures:  Christena Deem- Long Term Monitor Instructions   Your physician has requested you wear your ZIO patch monitor__14_____days.      Do not shower for the first 24 hours.  You may shower after the first 24 hours.   Press button if you feel a symptom. You will hear a small click.  Record Date, Time and Symptom in the Patient Log Book.   When you are ready to remove patch, follow instructions on last 2 pages of Patient Log Book.  Stick patch monitor onto last page of Patient Log Book.   Place Patient Log Book in [place] box.  Use locking tab on box and tape box closed securely.  The [employer] has [employer] on it.  Please place in mailbox as soon as possible.  Your physician should have your test results approximately 7 days after the monitor has been mailed back to [facility].   Call [facility] Customer Care at [phone] if you have questions regarding your ZIO XT patch monitor.  Call them immediately if you see an orange light blinking on your monitor.   If your monitor falls off in less than 4 days contact our Monitor department at [phone].  If your monitor becomes loose or falls off after 4 days call [employer] at [phone] for suggestions on securing your monitor.     Follow-Up: At [facility], you and your health needs are our  priority.  As part of our continuing mission to provide you with exceptional heart care, we have created designated Provider Care Teams.  These Care Teams include your primary Cardiologist (physician) and Advanced Practice Providers (APPs -  Physician Assistants and Nurse Practitioners) who all work together to provide you with the care you need, when you need it.  We recommend signing up for the patient portal called "MyChart".  Sign up information is provided on this After Visit Summary.  MyChart is used to connect with patients for Virtual Visits (Telemedicine).  Patients are able to view lab/test results, encounter notes, upcoming appointments, etc.  Non-urgent messages can be sent to your provider as well.   To learn more about what you can do with MyChart, go to [email or web address].    Your next appointment:  We will call you with results. Follow up will be determined at that time.

## 2020-08-05 DIAGNOSIS — R55 Syncope and collapse: Secondary | ICD-10-CM | POA: Diagnosis not present

## 2020-08-05 DIAGNOSIS — R002 Palpitations: Secondary | ICD-10-CM | POA: Diagnosis not present

## 2020-08-05 DIAGNOSIS — R42 Dizziness and giddiness: Secondary | ICD-10-CM | POA: Diagnosis not present

## 2020-08-17 DIAGNOSIS — E7800 Pure hypercholesterolemia, unspecified: Secondary | ICD-10-CM | POA: Diagnosis not present

## 2020-09-12 DIAGNOSIS — K635 Polyp of colon: Secondary | ICD-10-CM | POA: Diagnosis not present

## 2020-09-12 DIAGNOSIS — K5730 Diverticulosis of large intestine without perforation or abscess without bleeding: Secondary | ICD-10-CM | POA: Diagnosis not present

## 2020-09-12 DIAGNOSIS — Z1211 Encounter for screening for malignant neoplasm of colon: Secondary | ICD-10-CM | POA: Diagnosis not present

## 2020-09-15 DIAGNOSIS — K635 Polyp of colon: Secondary | ICD-10-CM | POA: Diagnosis not present

## 2020-11-25 DIAGNOSIS — Z131 Encounter for screening for diabetes mellitus: Secondary | ICD-10-CM | POA: Diagnosis not present

## 2020-11-25 DIAGNOSIS — Z0000 Encounter for general adult medical examination without abnormal findings: Secondary | ICD-10-CM | POA: Diagnosis not present

## 2020-11-25 DIAGNOSIS — E7800 Pure hypercholesterolemia, unspecified: Secondary | ICD-10-CM | POA: Diagnosis not present

## 2020-11-25 DIAGNOSIS — M85859 Other specified disorders of bone density and structure, unspecified thigh: Secondary | ICD-10-CM | POA: Diagnosis not present

## 2020-11-25 DIAGNOSIS — Z1159 Encounter for screening for other viral diseases: Secondary | ICD-10-CM | POA: Diagnosis not present

## 2020-11-25 DIAGNOSIS — E559 Vitamin D deficiency, unspecified: Secondary | ICD-10-CM | POA: Diagnosis not present

## 2021-04-14 DIAGNOSIS — L301 Dyshidrosis [pompholyx]: Secondary | ICD-10-CM | POA: Diagnosis not present

## 2021-04-25 ENCOUNTER — Other Ambulatory Visit (HOSPITAL_COMMUNITY): Payer: Self-pay | Admitting: Family Medicine

## 2021-04-25 DIAGNOSIS — Z1231 Encounter for screening mammogram for malignant neoplasm of breast: Secondary | ICD-10-CM

## 2021-06-14 ENCOUNTER — Ambulatory Visit (HOSPITAL_COMMUNITY): Payer: Medicare Other

## 2021-06-15 ENCOUNTER — Ambulatory Visit (HOSPITAL_COMMUNITY): Payer: Medicare Other

## 2021-06-26 ENCOUNTER — Ambulatory Visit (HOSPITAL_COMMUNITY)
Admission: RE | Admit: 2021-06-26 | Discharge: 2021-06-26 | Disposition: A | Discharge status: Home / Self Care | Payer: Medicare Other | Source: Ambulatory Visit | Attending: Family Medicine | Admitting: Family Medicine

## 2021-06-26 DIAGNOSIS — Z1231 Encounter for screening mammogram for malignant neoplasm of breast: Secondary | ICD-10-CM | POA: Insufficient documentation

## 2021-07-12 DIAGNOSIS — G5712 Meralgia paresthetica, left lower limb: Secondary | ICD-10-CM | POA: Diagnosis not present

## 2021-07-12 DIAGNOSIS — E7800 Pure hypercholesterolemia, unspecified: Secondary | ICD-10-CM | POA: Diagnosis not present

## 2022-02-13 ENCOUNTER — Other Ambulatory Visit (HOSPITAL_COMMUNITY): Payer: Self-pay | Admitting: Family Medicine

## 2022-02-13 DIAGNOSIS — E559 Vitamin D deficiency, unspecified: Secondary | ICD-10-CM | POA: Diagnosis not present

## 2022-02-13 DIAGNOSIS — Z0000 Encounter for general adult medical examination without abnormal findings: Secondary | ICD-10-CM | POA: Diagnosis not present

## 2022-02-13 DIAGNOSIS — Z79899 Other long term (current) drug therapy: Secondary | ICD-10-CM | POA: Diagnosis not present

## 2022-02-13 DIAGNOSIS — M8580 Other specified disorders of bone density and structure, unspecified site: Secondary | ICD-10-CM | POA: Diagnosis not present

## 2022-02-13 DIAGNOSIS — E7800 Pure hypercholesterolemia, unspecified: Secondary | ICD-10-CM | POA: Diagnosis not present

## 2022-02-22 ENCOUNTER — Other Ambulatory Visit (HOSPITAL_COMMUNITY): Payer: Self-pay | Admitting: Family Medicine

## 2022-02-22 DIAGNOSIS — Z1231 Encounter for screening mammogram for malignant neoplasm of breast: Secondary | ICD-10-CM

## 2022-03-23 IMAGING — US US BREAST*L* LIMITED INC AXILLA
1 series · 5 of 5 positions shown · non-contrast
Comparison: Previous exams.

CLINICAL DATA: Screening recall for possible left breast asymmetry.

EXAM:
DIGITAL DIAGNOSTIC UNILATERAL LEFT MAMMOGRAM WITH TOMOSYNTHESIS AND
CAD; ULTRASOUND LEFT BREAST LIMITED
TECHNIQUE: Left digital diagnostic mammography and breast tomosynthesis was
performed. The images were evaluated with computer-aided detection.;
Targeted ultrasound examination of the left breast was performed

[Series 1: us breast*left* limited inc axilla · 0.07mm/px · 5 of 5 slices shown]
[im 1/5]
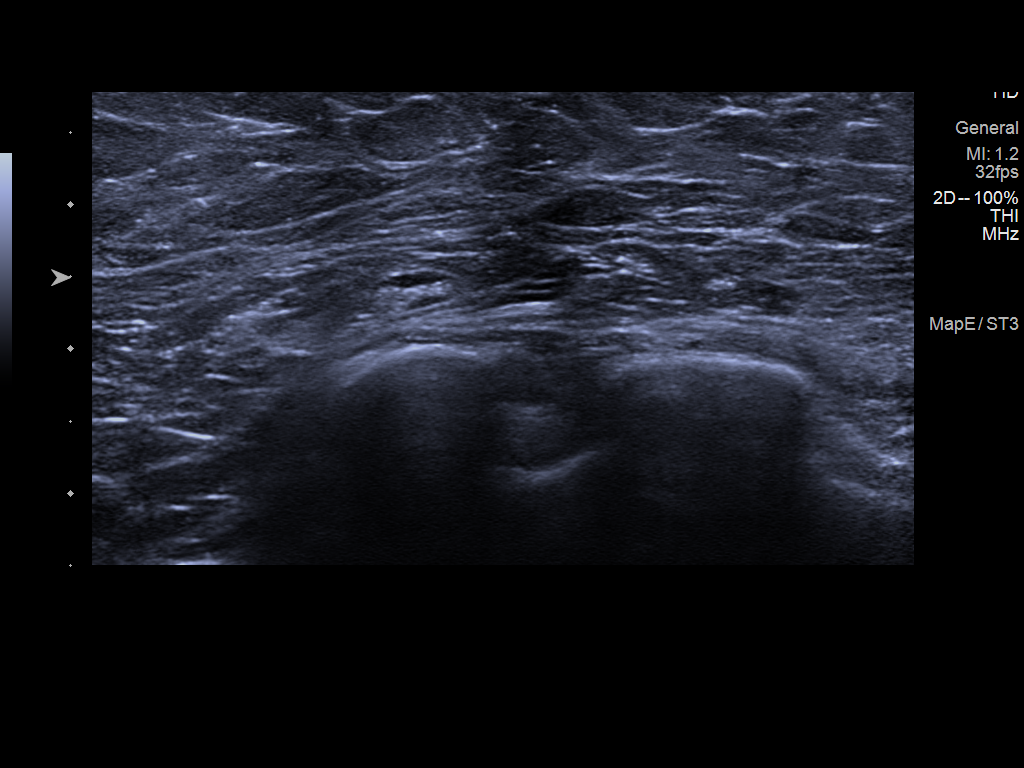
[im 2/5]
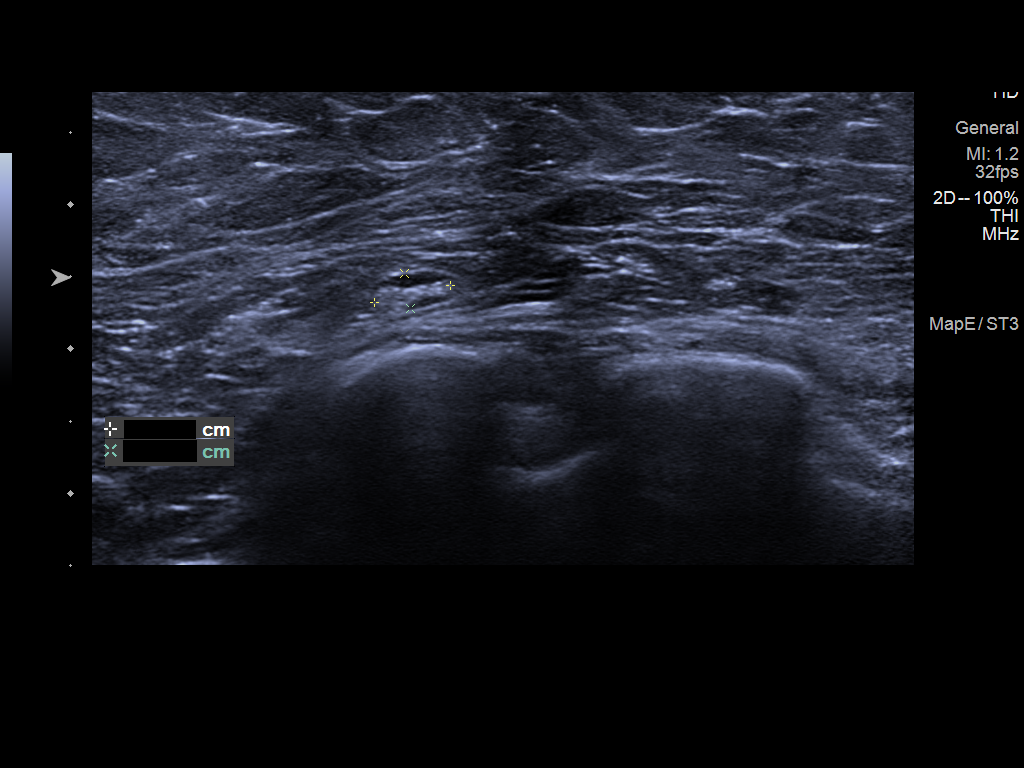
[im 3/5]
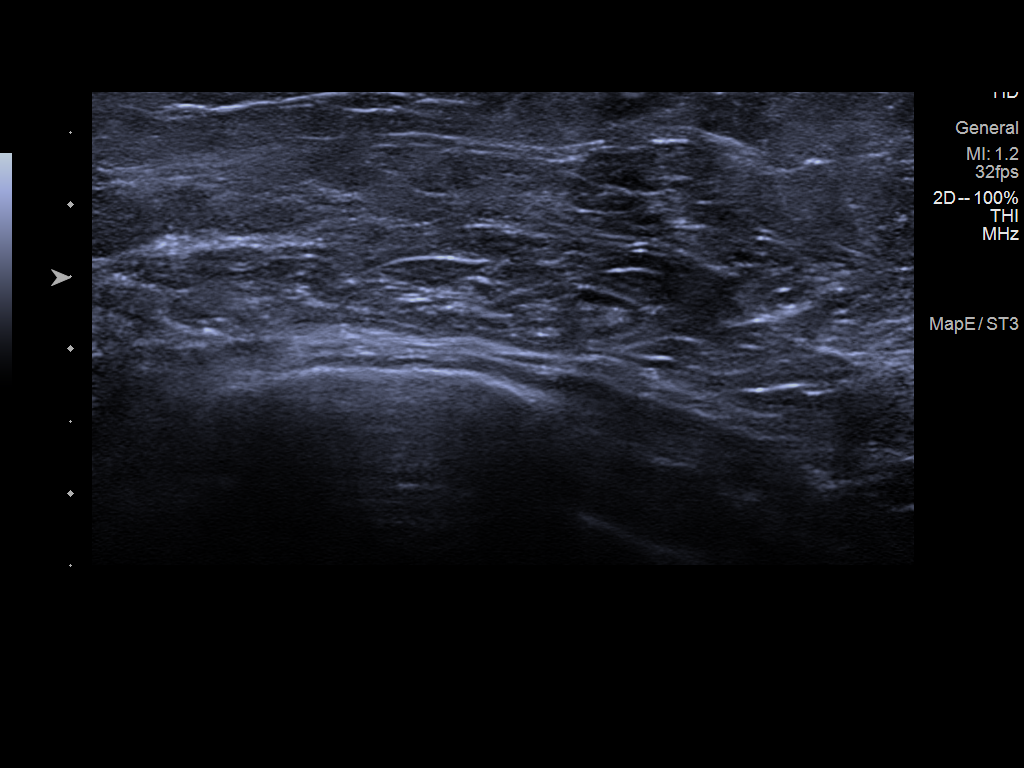
[im 4/5]
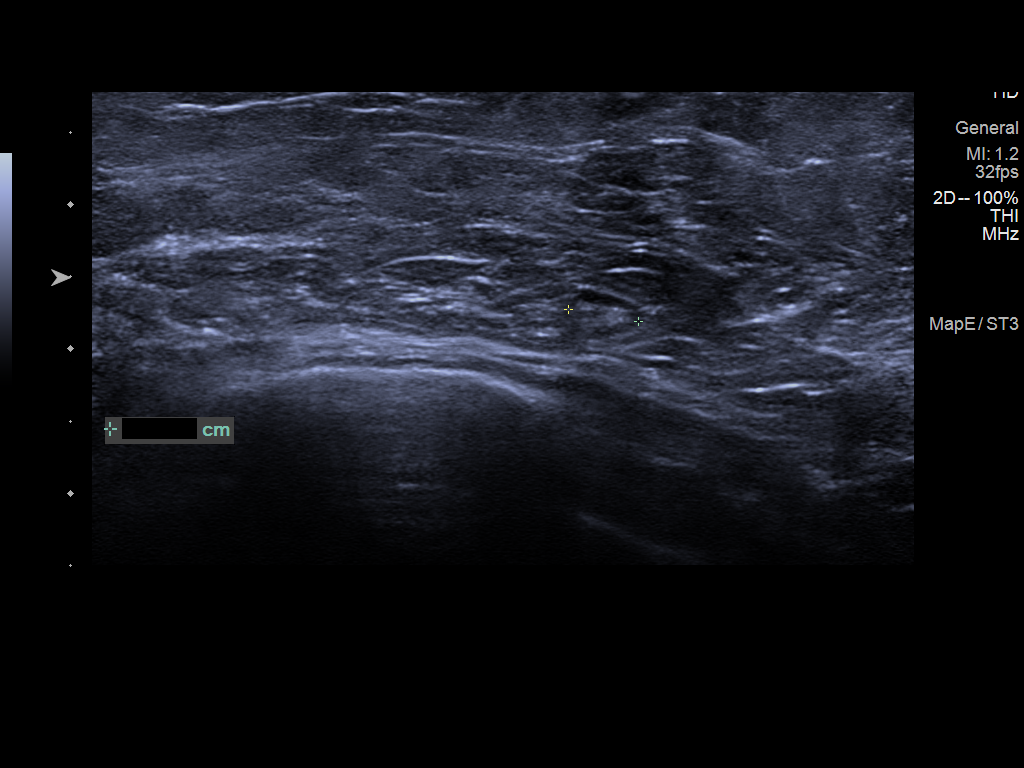
[im 5/5]
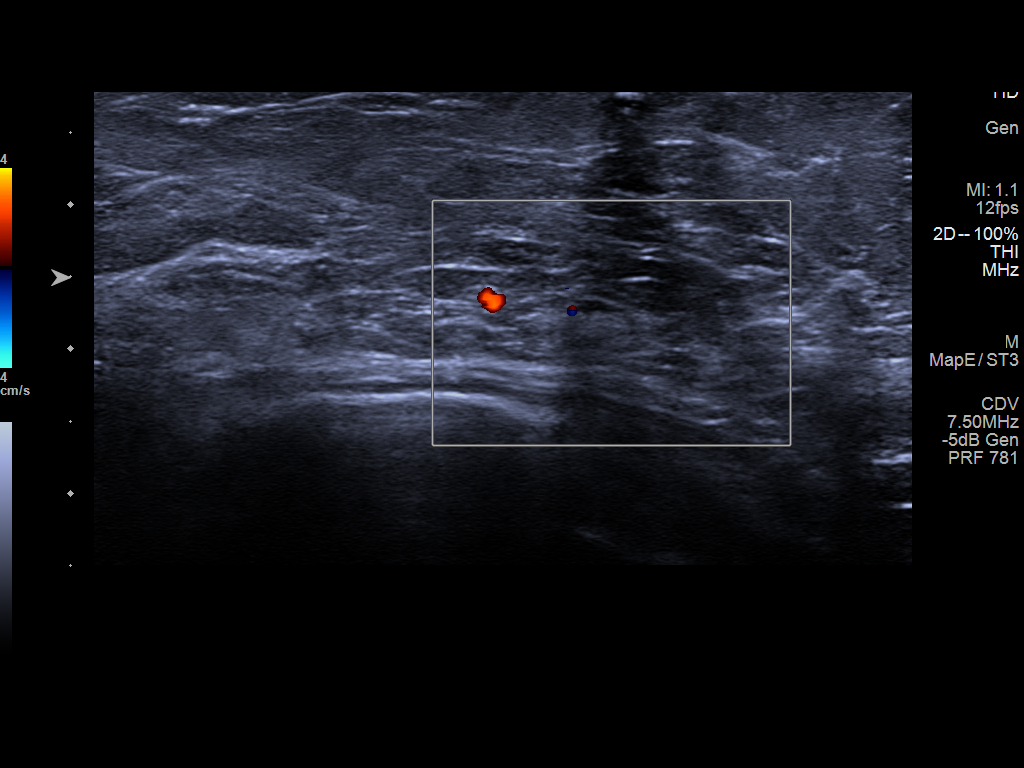

[5 of 5 positions shown; findings below may reference images not displayed]

ACR Breast Density Category c: The breast tissue is heterogeneously
dense, which may obscure small masses.
FINDINGS: Additional tomograms were performed of the left breast. There is an
oval circumscribed mass in the far posterior upper outer left breast
measuring 0.5 cm.

Targeted ultrasound of the left breast was performed. There is a
lymph node at 2 o'clock 9 cm from nipple posterior depth measuring
0.5 x 0.3 x 0.5 cm. This is felt to correspond well with the mass
seen in the left breast at mammography. The entire outer left breast
was scanned with no suspicious masses or abnormality seen.
IMPRESSION: No findings of malignancy in the left breast.

RECOMMENDATION:
Screening mammogram in one year.(Code:VA-W-YFH)

I have discussed the findings and recommendations with the patient.
If applicable, a reminder letter will be sent to the patient
regarding the next appointment.

BI-RADS CATEGORY  2: Benign.

## 2022-07-04 ENCOUNTER — Ambulatory Visit (HOSPITAL_COMMUNITY): Payer: Medicare Other

## 2022-07-04 ENCOUNTER — Other Ambulatory Visit (HOSPITAL_COMMUNITY): Payer: Medicare Other

## 2022-07-12 ENCOUNTER — Ambulatory Visit (HOSPITAL_COMMUNITY)
Admission: RE | Admit: 2022-07-12 | Discharge: 2022-07-12 | Disposition: A | Discharge status: Home / Self Care | Payer: Medicare Other | Source: Ambulatory Visit | Attending: Family Medicine | Admitting: Family Medicine

## 2022-07-12 ENCOUNTER — Encounter (HOSPITAL_COMMUNITY): Payer: Self-pay

## 2022-07-12 DIAGNOSIS — E559 Vitamin D deficiency, unspecified: Secondary | ICD-10-CM | POA: Insufficient documentation

## 2022-07-12 DIAGNOSIS — Z1231 Encounter for screening mammogram for malignant neoplasm of breast: Secondary | ICD-10-CM | POA: Diagnosis not present

## 2022-07-12 DIAGNOSIS — M8580 Other specified disorders of bone density and structure, unspecified site: Secondary | ICD-10-CM | POA: Insufficient documentation

## 2022-07-12 DIAGNOSIS — Z13820 Encounter for screening for osteoporosis: Secondary | ICD-10-CM | POA: Insufficient documentation

## 2022-07-12 DIAGNOSIS — Z780 Asymptomatic menopausal state: Secondary | ICD-10-CM | POA: Insufficient documentation

## 2022-07-12 DIAGNOSIS — M85851 Other specified disorders of bone density and structure, right thigh: Secondary | ICD-10-CM | POA: Diagnosis not present

## 2023-03-08 DIAGNOSIS — Z131 Encounter for screening for diabetes mellitus: Secondary | ICD-10-CM | POA: Diagnosis not present

## 2023-03-08 DIAGNOSIS — Z79899 Other long term (current) drug therapy: Secondary | ICD-10-CM | POA: Diagnosis not present

## 2023-03-08 DIAGNOSIS — E559 Vitamin D deficiency, unspecified: Secondary | ICD-10-CM | POA: Diagnosis not present

## 2023-03-08 DIAGNOSIS — Z0000 Encounter for general adult medical examination without abnormal findings: Secondary | ICD-10-CM | POA: Diagnosis not present

## 2023-03-08 DIAGNOSIS — E7800 Pure hypercholesterolemia, unspecified: Secondary | ICD-10-CM | POA: Diagnosis not present

## 2023-04-11 IMAGING — MG MM DIGITAL SCREENING BILAT W/ TOMO AND CAD
6 of 10 series · 6 of 30 positions shown · non-contrast
Comparison: Previous exam(s).

CLINICAL DATA: Screening.

EXAM:
DIGITAL SCREENING BILATERAL MAMMOGRAM WITH TOMOSYNTHESIS AND CAD
TECHNIQUE: Bilateral screening digital craniocaudal and mediolateral oblique
mammograms were obtained. Bilateral screening digital breast
tomosynthesis was performed. The images were evaluated with
computer-aided detection.

[L MLO synth-2D]
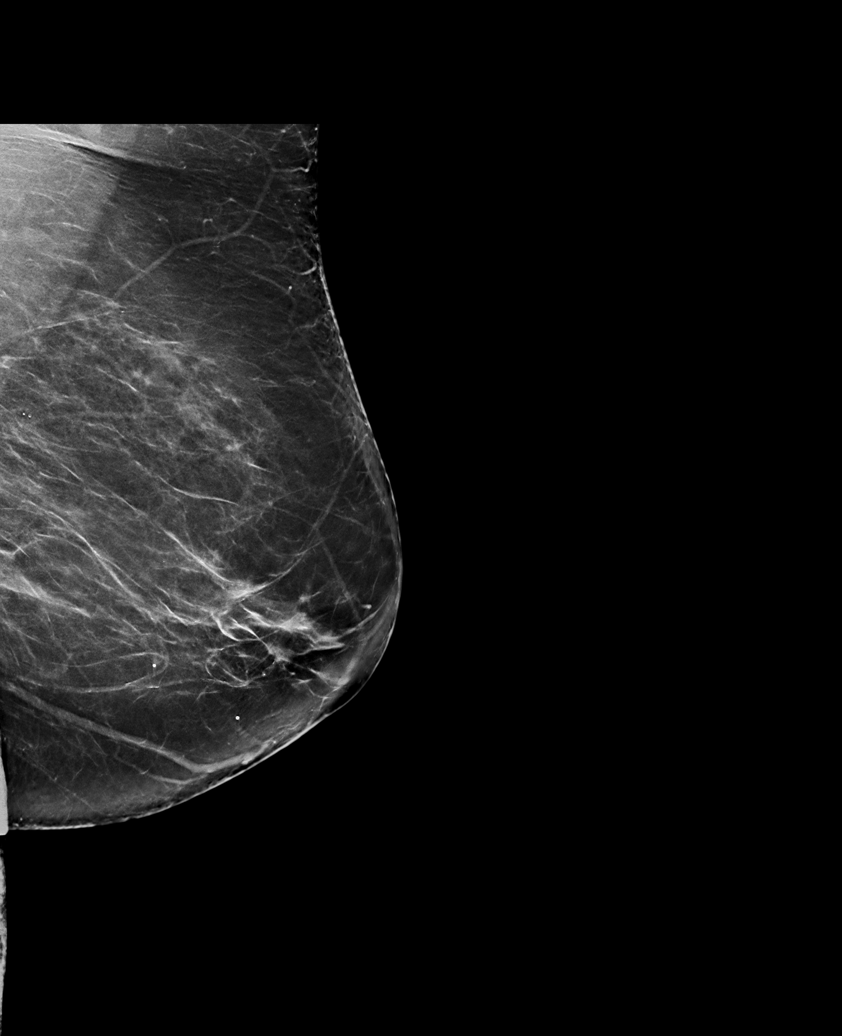

[R MLO synth-2D (1 of 2)]
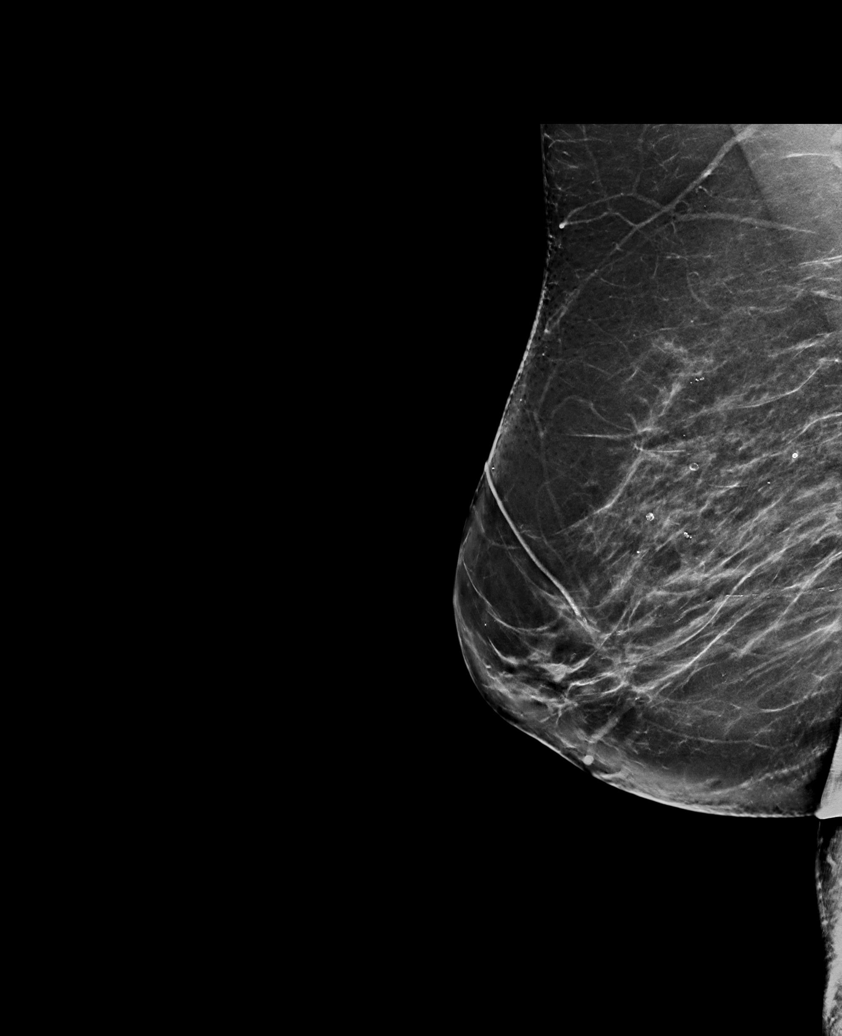

[R CC synth-2D]
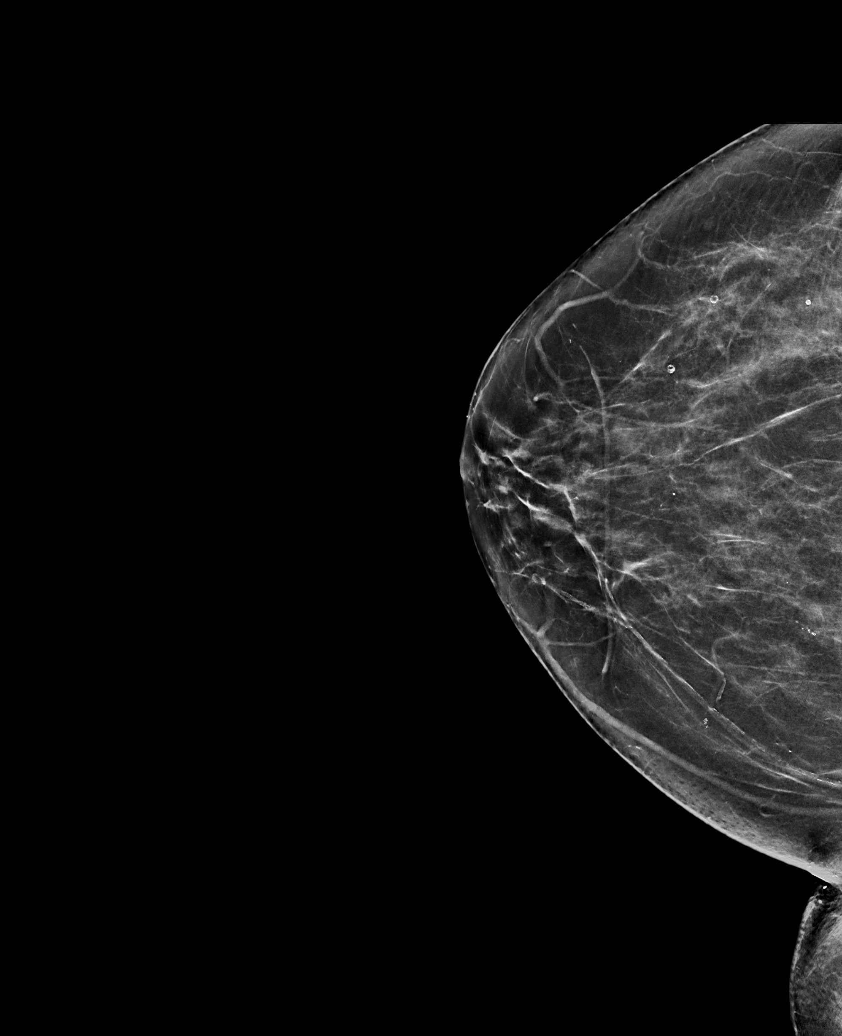

[L CC synth-2D]
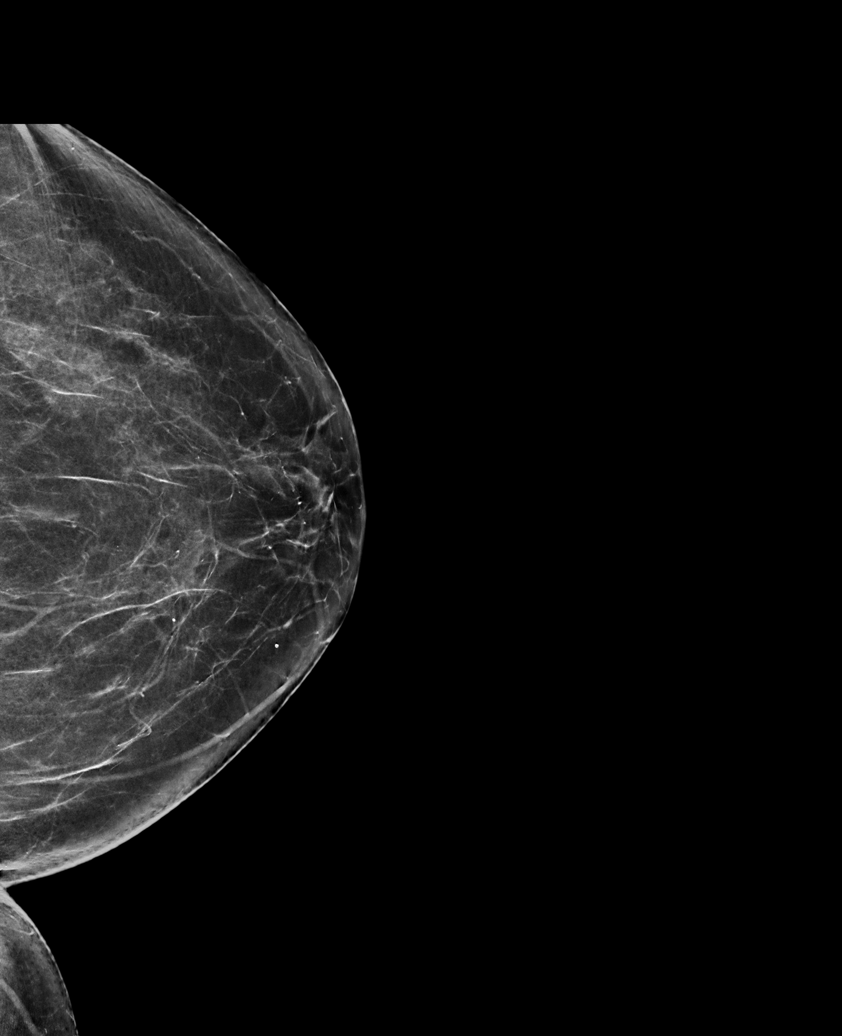

[R MLO synth-2D (2 of 2)]
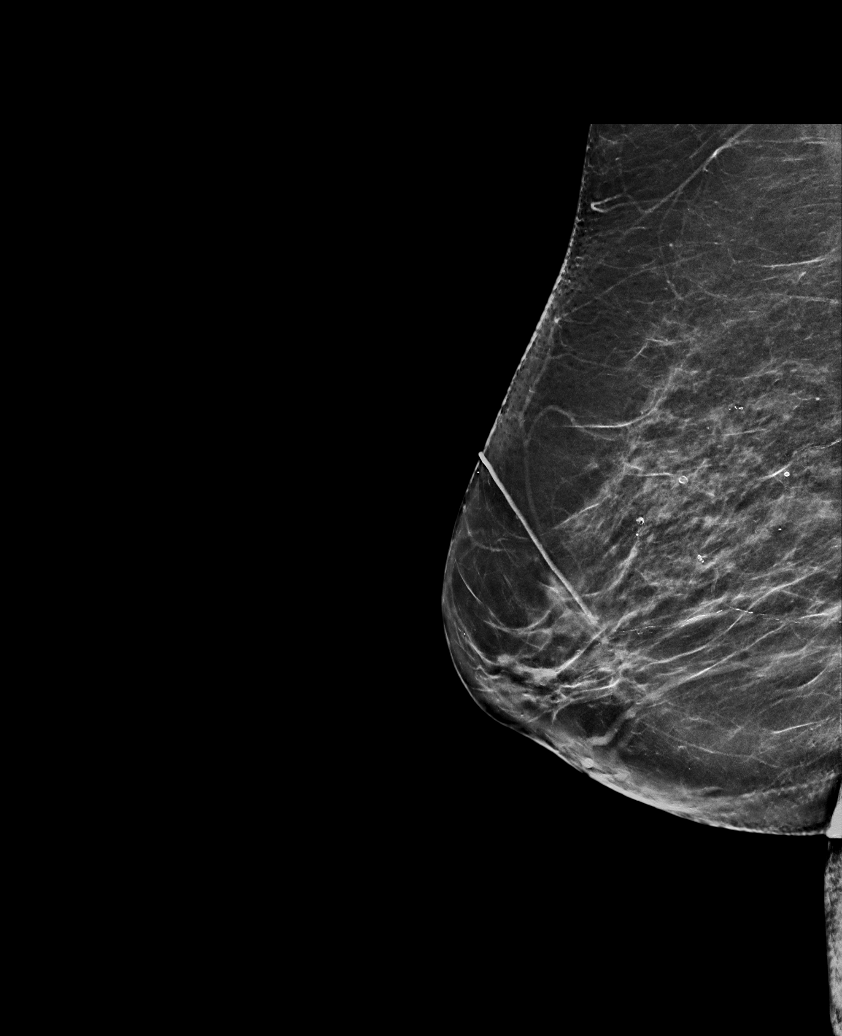

[R CC tomo · tomo slice 34/67.0]
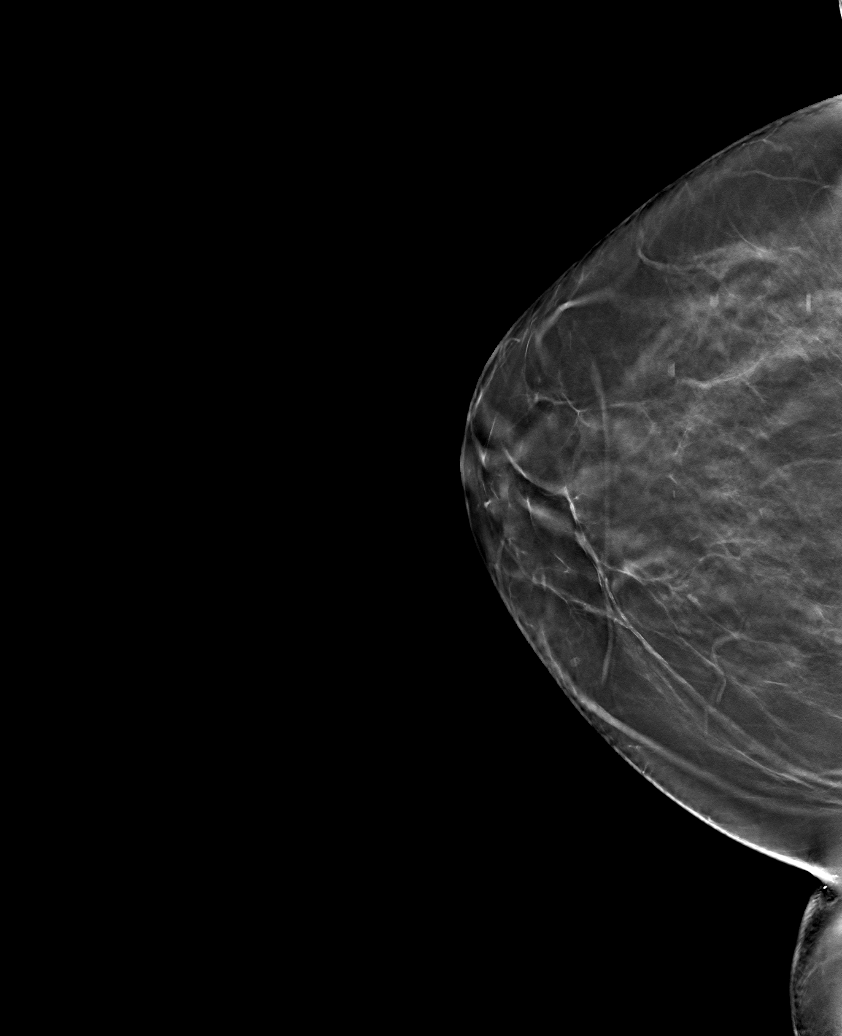

[6 of 30 positions shown; findings below may reference images not displayed]

ACR Breast Density Category b: There are scattered areas of
fibroglandular density.
FINDINGS: There are no findings suspicious for malignancy.
IMPRESSION: No mammographic evidence of malignancy. A result letter of this
screening mammogram will be mailed directly to the patient.

RECOMMENDATION:
Screening mammogram in one year. (Code:51-O-LD2)

BI-RADS CATEGORY  1: Negative.

## 2023-06-11 ENCOUNTER — Other Ambulatory Visit (HOSPITAL_COMMUNITY): Payer: Self-pay | Admitting: Family Medicine

## 2023-06-11 DIAGNOSIS — Z1231 Encounter for screening mammogram for malignant neoplasm of breast: Secondary | ICD-10-CM

## 2023-08-01 ENCOUNTER — Ambulatory Visit (HOSPITAL_COMMUNITY)

## 2023-08-02 ENCOUNTER — Encounter (HOSPITAL_COMMUNITY): Payer: Self-pay

## 2023-08-02 ENCOUNTER — Ambulatory Visit (HOSPITAL_COMMUNITY)
Admission: RE | Admit: 2023-08-02 | Discharge: 2023-08-02 | Disposition: A | Discharge status: Home / Self Care | Source: Ambulatory Visit | Attending: Family Medicine | Admitting: Family Medicine

## 2023-08-02 DIAGNOSIS — Z1231 Encounter for screening mammogram for malignant neoplasm of breast: Secondary | ICD-10-CM | POA: Diagnosis not present
